# Patient Record
Sex: Female | Born: 1990 | Race: White | Hispanic: No | Marital: Married | Smoking: Former smoker
Health system: Southern US, Community
[De-identification: ages and names within clinical notes are randomized; demographics above are authoritative.]

## PROBLEM LIST (undated history)

## (undated) DIAGNOSIS — H9325 Central auditory processing disorder: Secondary | ICD-10-CM

## (undated) DIAGNOSIS — F909 Attention-deficit hyperactivity disorder, unspecified type: Secondary | ICD-10-CM

## (undated) DIAGNOSIS — K529 Noninfective gastroenteritis and colitis, unspecified: Secondary | ICD-10-CM

## (undated) DIAGNOSIS — F429 Obsessive-compulsive disorder, unspecified: Secondary | ICD-10-CM

## (undated) DIAGNOSIS — N946 Dysmenorrhea, unspecified: Secondary | ICD-10-CM

## (undated) DIAGNOSIS — F419 Anxiety disorder, unspecified: Secondary | ICD-10-CM

## (undated) DIAGNOSIS — R55 Syncope and collapse: Secondary | ICD-10-CM

## (undated) DIAGNOSIS — F4312 Post-traumatic stress disorder, chronic: Secondary | ICD-10-CM

## (undated) DIAGNOSIS — F913 Oppositional defiant disorder: Secondary | ICD-10-CM

## (undated) DIAGNOSIS — F32A Depression, unspecified: Secondary | ICD-10-CM

## (undated) HISTORY — DX: Noninfective gastroenteritis and colitis, unspecified: K52.9

## (undated) HISTORY — DX: Oppositional defiant disorder: F91.3

## (undated) HISTORY — DX: Obsessive-compulsive disorder, unspecified: F42.9

## (undated) HISTORY — PX: WISDOM TOOTH EXTRACTION: SHX21

## (undated) HISTORY — DX: Anxiety disorder, unspecified: F41.9

## (undated) HISTORY — DX: Dysmenorrhea, unspecified: N94.6

## (undated) HISTORY — DX: Syncope and collapse: R55

## (undated) HISTORY — PX: BICEPS TENDON REPAIR: SHX566

## (undated) HISTORY — DX: Attention-deficit hyperactivity disorder, unspecified type: F90.9

## (undated) HISTORY — DX: Depression, unspecified: F32.A

---
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
## 2010-03-26 ENCOUNTER — Ambulatory Visit (HOSPITAL_COMMUNITY): Payer: Self-pay | Admitting: Psychiatry

## 2010-05-26 ENCOUNTER — Ambulatory Visit (HOSPITAL_COMMUNITY)
Admission: RE | Admit: 2010-05-26 | Discharge: 2010-05-26 | Payer: Self-pay | Source: Home / Self Care | Attending: Psychiatry | Admitting: Psychiatry

## 2010-07-09 ENCOUNTER — Encounter (INDEPENDENT_AMBULATORY_CARE_PROVIDER_SITE_OTHER): Payer: BC Managed Care – PPO | Admitting: Psychiatry

## 2010-07-09 DIAGNOSIS — F909 Attention-deficit hyperactivity disorder, unspecified type: Secondary | ICD-10-CM

## 2010-08-27 ENCOUNTER — Encounter (INDEPENDENT_AMBULATORY_CARE_PROVIDER_SITE_OTHER): Payer: BC Managed Care – PPO | Admitting: Psychiatry

## 2010-08-27 DIAGNOSIS — F909 Attention-deficit hyperactivity disorder, unspecified type: Secondary | ICD-10-CM

## 2010-09-30 ENCOUNTER — Encounter (HOSPITAL_COMMUNITY): Payer: BC Managed Care – PPO | Admitting: Psychiatry

## 2010-10-14 ENCOUNTER — Encounter (INDEPENDENT_AMBULATORY_CARE_PROVIDER_SITE_OTHER): Payer: BC Managed Care – PPO | Admitting: Psychiatry

## 2010-10-14 DIAGNOSIS — F411 Generalized anxiety disorder: Secondary | ICD-10-CM

## 2010-10-14 DIAGNOSIS — F909 Attention-deficit hyperactivity disorder, unspecified type: Secondary | ICD-10-CM

## 2010-12-14 ENCOUNTER — Encounter (HOSPITAL_COMMUNITY): Payer: BC Managed Care – PPO | Admitting: Psychiatry

## 2010-12-28 ENCOUNTER — Encounter (INDEPENDENT_AMBULATORY_CARE_PROVIDER_SITE_OTHER): Payer: BC Managed Care – PPO | Admitting: Psychiatry

## 2010-12-28 DIAGNOSIS — F411 Generalized anxiety disorder: Secondary | ICD-10-CM

## 2010-12-28 DIAGNOSIS — F909 Attention-deficit hyperactivity disorder, unspecified type: Secondary | ICD-10-CM

## 2011-02-22 ENCOUNTER — Encounter (HOSPITAL_COMMUNITY): Payer: Self-pay

## 2011-03-28 ENCOUNTER — Encounter (HOSPITAL_COMMUNITY): Payer: Self-pay | Admitting: Psychiatry

## 2011-03-28 ENCOUNTER — Ambulatory Visit (INDEPENDENT_AMBULATORY_CARE_PROVIDER_SITE_OTHER): Payer: BC Managed Care – PPO | Admitting: Psychiatry

## 2011-03-28 VITALS — BP 118/78 | Ht 61.0 in | Wt 132.0 lb

## 2011-03-28 DIAGNOSIS — F913 Oppositional defiant disorder: Secondary | ICD-10-CM

## 2011-03-28 DIAGNOSIS — F909 Attention-deficit hyperactivity disorder, unspecified type: Secondary | ICD-10-CM

## 2011-03-28 DIAGNOSIS — F429 Obsessive-compulsive disorder, unspecified: Secondary | ICD-10-CM

## 2011-03-28 MED ORDER — METHYLPHENIDATE HCL ER (LA) 30 MG PO CP24: 30.0000 mg | ORAL_CAPSULE | Freq: Every day | ORAL | Status: DC

## 2011-03-28 MED ORDER — METHYLPHENIDATE HCL ER (LA) 30 MG PO CP24: 30.0000 mg | ORAL_CAPSULE | ORAL | Status: DC

## 2011-03-28 NOTE — Progress Notes (Signed)
   [facility] Follow-up Outpatient Visit  Jennifer Singh 1990-11-20   Subjective: The patient is a 20 year old female who has been followed by [facility] since November of 2008. She is currently diagnosed with OCD, ADHD, and oppositional defiant disorder. He is still in school. This semester she is taking an [place] class along with theater. She did end up dropping one class. She states her anxiety level is increasing. She is only taking 2 of her Luvox 50 mg in the morning. She supposed be taking it 3 times a day. She does continue to take her Ritalin LA 30 mg daily. The patient is trying to quit smoking. She did have a bad reaction when I gave her Wellbutrin XL in the past. I recommended this point that she try nicotine patches. The patient reports getting her for speeding ticket. Mom is concerned for the for the patient's welfare.  Filed Vitals:   03/28/11 1100  BP: 118/78    Mental Status Examination  Appearance: Casually dressed Alert: Yes Attention: good  Cooperative: Yes Eye Contact: Good Speech: Rapid speech which is normal for patient Psychomotor Activity: Normal Memory/Concentration: Intact Oriented: person, place, time/date and situation Mood: Euthymic Affect: Full Range Thought Processes and Associations: Logical Fund of Knowledge: Fair Thought Content: Denies suicidal or homicidal thought Insight: Fair Judgement: Fair  Diagnosis: OCD, ADHD, ODD  Treatment Plan: I advised patient to increase her Luvox to the 3 pills daily that she supposed to be taking. I feel that this will help cope with her anxiety. She wants to discuss with mom her current symptoms. I plan to see her back in 6 weeks for a 30 minute appointment. I have asked for her to have her mother accompany her to this appointment. Patient is calmly with any further concerns.  Jamse Mead, MD

## 2011-05-11 ENCOUNTER — Ambulatory Visit (HOSPITAL_COMMUNITY): Payer: BC Managed Care – PPO | Admitting: Psychiatry

## 2011-05-27 ENCOUNTER — Ambulatory Visit (HOSPITAL_COMMUNITY): Payer: BC Managed Care – PPO | Admitting: Psychiatry

## 2011-06-03 ENCOUNTER — Ambulatory Visit (HOSPITAL_COMMUNITY): Payer: BC Managed Care – PPO | Admitting: Psychiatry

## 2011-06-08 ENCOUNTER — Ambulatory Visit (INDEPENDENT_AMBULATORY_CARE_PROVIDER_SITE_OTHER): Payer: BC Managed Care – PPO | Admitting: Psychiatry

## 2011-06-08 ENCOUNTER — Encounter (HOSPITAL_COMMUNITY): Payer: Self-pay | Admitting: Psychiatry

## 2011-06-08 DIAGNOSIS — F429 Obsessive-compulsive disorder, unspecified: Secondary | ICD-10-CM

## 2011-06-08 DIAGNOSIS — F909 Attention-deficit hyperactivity disorder, unspecified type: Secondary | ICD-10-CM

## 2011-06-08 DIAGNOSIS — F913 Oppositional defiant disorder: Secondary | ICD-10-CM | POA: Insufficient documentation

## 2011-06-08 DIAGNOSIS — F902 Attention-deficit hyperactivity disorder, combined type: Secondary | ICD-10-CM | POA: Insufficient documentation

## 2011-06-08 MED ORDER — METHYLPHENIDATE HCL ER (LA) 30 MG PO CP24: 30.0000 mg | ORAL_CAPSULE | ORAL | Status: DC

## 2011-06-08 MED ORDER — METHYLPHENIDATE HCL ER (LA) 30 MG PO CP24: 30.0000 mg | ORAL_CAPSULE | Freq: Every day | ORAL | Status: DC

## 2011-06-08 NOTE — Progress Notes (Signed)
   [facility] Follow-up Outpatient Visit  Jennifer Singh 1990-11-20   Subjective: The patient is a 21 year old female who has been followed by [facility] since November of 2008. She is currently diagnosed with OCD, ADHD, and oppositional defiant disorder. She is still in school. Patient is currently taking 4 classes, but is planning on dropping one. She reports her grades as B's and C's. She is happy with this. She did get fired from [employer]. She is still living at home and still dating her girlfriend. He feels like throwing up on the Luvox has helped, however she's been out of it for a week and a half. She mislaid the bottle. She has not checked with the pharmacy to see if she has refills. She endorses good sleep and appetite. She feels like her focus is working well.  Filed Vitals:   06/08/11 1107  BP: 108/62    Mental Status Examination  Appearance: Casually dressed Alert: Yes Attention: good  Cooperative: Yes Eye Contact: Good Speech: Rapid speech which is normal for patient Psychomotor Activity: Normal Memory/Concentration: Intact Oriented: person, place, time/date and situation Mood: Euthymic Affect: Full Range Thought Processes and Associations: Logical Fund of Knowledge: Fair Thought Content: Denies suicidal or homicidal thought Insight: Fair Judgement: Fair  Diagnosis: OCD, ADHD, ODD  Treatment Plan: We will not make any changes today. Patient followup with pharmacy. I will continue her Luvox and Ritalin LA. I will see her back in 3 months. Jamse Mead, MD

## 2011-08-10 ENCOUNTER — Encounter (HOSPITAL_COMMUNITY): Payer: Self-pay | Admitting: Psychiatry

## 2011-08-10 ENCOUNTER — Ambulatory Visit (INDEPENDENT_AMBULATORY_CARE_PROVIDER_SITE_OTHER): Payer: BC Managed Care – PPO | Admitting: Psychiatry

## 2011-08-10 VITALS — BP 118/68 | Ht 61.0 in | Wt 128.0 lb

## 2011-08-10 DIAGNOSIS — F909 Attention-deficit hyperactivity disorder, unspecified type: Secondary | ICD-10-CM

## 2011-08-10 MED ORDER — METHYLPHENIDATE HCL ER (LA) 30 MG PO CP24: 30.0000 mg | ORAL_CAPSULE | Freq: Every day | ORAL | Status: DC

## 2011-08-10 MED ORDER — FLUVOXAMINE MALEATE 100 MG PO TABS: 100.0000 mg | ORAL_TABLET | Freq: Two times a day (BID) | ORAL | Status: DC

## 2011-08-10 NOTE — Progress Notes (Signed)
   [facility] Follow-up Outpatient Visit  Jennifer Singh Mar 09, 1991   Subjective: The patient is a 21 year old female who has been followed by [facility] since November of 2008. She is currently diagnosed with OCD, ADHD, and oppositional defiant disorder. The patient currently stable on Luvox and Ritalin LA. She recalls last month, stating that she was getting panic attacks. She presents today. She reports that she hasn't had any panic attacks in about 3 weeks. She has been worrying a lot. A lot of her stress involves her 28 year old girlfriend. Her last panic attack occurred when the girlfriend went to a party with her friends without her. The patient reports that she stressed over school. She did drop one of her classes. She now has to A's and one D. She and her girlfriend plan on moving in together in approximately 2 months. Patient reports the girlfriend's father is getting worse behavior-wise with girlfriend.  Filed Vitals:   08/10/11 1038  BP: 118/68    Mental Status Examination  Appearance: Casually dressed Alert: Yes Attention: good  Cooperative: Yes Eye Contact: Good Speech: Rapid speech which is normal for patient Psychomotor Activity: Normal Memory/Concentration: Intact Oriented: person, place, time/date and situation Mood: Euthymic Affect: Full Range Thought Processes and Associations: Logical Fund of Knowledge: Fair Thought Content: Denies suicidal or homicidal thought Insight: Fair Judgement: Fair  Diagnosis: OCD, ADHD, ODD  Treatment Plan: We will increase the Luvox to 100 mg twice a day. Continue the Ritalin LA. Patient is provided with 2 discrete scripts. I will see her back in 2 months. Jamse Mead, MD

## 2011-09-06 ENCOUNTER — Ambulatory Visit (HOSPITAL_COMMUNITY): Payer: BC Managed Care – PPO | Admitting: Psychiatry

## 2011-10-10 ENCOUNTER — Ambulatory Visit (HOSPITAL_COMMUNITY): Payer: BC Managed Care – PPO | Admitting: Psychiatry

## 2011-10-21 ENCOUNTER — Encounter (HOSPITAL_COMMUNITY): Payer: Self-pay | Admitting: Psychiatry

## 2011-10-21 ENCOUNTER — Ambulatory Visit (INDEPENDENT_AMBULATORY_CARE_PROVIDER_SITE_OTHER): Payer: BC Managed Care – PPO | Admitting: Psychiatry

## 2011-10-21 VITALS — BP 108/62 | Ht 61.0 in | Wt 129.0 lb

## 2011-10-21 DIAGNOSIS — F429 Obsessive-compulsive disorder, unspecified: Secondary | ICD-10-CM

## 2011-10-21 DIAGNOSIS — F909 Attention-deficit hyperactivity disorder, unspecified type: Secondary | ICD-10-CM

## 2011-10-21 DIAGNOSIS — F913 Oppositional defiant disorder: Secondary | ICD-10-CM

## 2011-10-21 MED ORDER — METHYLPHENIDATE HCL ER (LA) 30 MG PO CP24: 30.0000 mg | ORAL_CAPSULE | Freq: Every day | ORAL | Status: DC

## 2011-10-21 NOTE — Progress Notes (Signed)
   [facility] Follow-up Outpatient Visit  Jennifer Singh 1991/01/29   Subjective: The patient is a 21 year old female who has been followed by [facility] since November of 2008. She is currently diagnosed with OCD, ADHD, and oppositional defiant disorder. At her last appointment, I increased her Luvox to 100 mg twice a day. I also continued her Ritalin LA 30 mg daily. The patient is not having any panic attacks. She is still living at home. His current girlfriend has moved in with the family. This seems to be going well. The patient is now working third shift at a different [employer]. She works from 10 PM to 7 AM. She is asking how to take her Ritalin and still function throughout work hours. She is also asking whether or not she can use a nicotine patch, since she had such a bad reaction to Wellbutrin XL. The patient feels that her anxiety is better controlled. She feels that her stimulant controls her focus and attention. She is calmer in todays appointment.  Filed Vitals:   10/21/11 1402  BP: 108/62    Mental Status Examination  Appearance: Casually dressed Alert: Yes Attention: good  Cooperative: Yes Eye Contact: Good Speech: Rapid speech which is normal for patient Psychomotor Activity: Normal Memory/Concentration: Intact Oriented: person, place, time/date and situation Mood: Euthymic Affect: Full Range Thought Processes and Associations: Logical Fund of Knowledge: Fair Thought Content: Denies suicidal or homicidal thought Insight: Fair Judgement: Fair  Diagnosis: OCD, ADHD, ODD  Treatment Plan: We will continue the Luvox at 100 mg twice a day. Continue the Ritalin LA. Patient is provided with 3 discrete scripts. I will see her back in 3 months. Patient should try taking Ritalin LA 2 hours prior to shift. She may use a nicotine patch. Call with concerns. Jamse Mead, MD

## 2012-01-27 ENCOUNTER — Ambulatory Visit (INDEPENDENT_AMBULATORY_CARE_PROVIDER_SITE_OTHER): Payer: BC Managed Care – PPO | Admitting: Psychiatry

## 2012-01-27 ENCOUNTER — Encounter (HOSPITAL_COMMUNITY): Payer: Self-pay | Admitting: Psychiatry

## 2012-01-27 VITALS — BP 110/65 | Ht 61.0 in | Wt 132.0 lb

## 2012-01-27 DIAGNOSIS — F909 Attention-deficit hyperactivity disorder, unspecified type: Secondary | ICD-10-CM

## 2012-01-27 DIAGNOSIS — F913 Oppositional defiant disorder: Secondary | ICD-10-CM

## 2012-01-27 DIAGNOSIS — F429 Obsessive-compulsive disorder, unspecified: Secondary | ICD-10-CM

## 2012-01-27 MED ORDER — METHYLPHENIDATE HCL ER (LA) 30 MG PO CP24: 30.0000 mg | ORAL_CAPSULE | ORAL | Status: DC

## 2012-01-27 MED ORDER — METHYLPHENIDATE HCL 10 MG PO TABS: 10.0000 mg | ORAL_TABLET | Freq: Every day | ORAL | Status: DC

## 2012-01-27 NOTE — Progress Notes (Signed)
   [facility] Follow-up Outpatient Visit  Jennifer Singh 08/19/1990   Subjective: The patient is a 21 year old female who has been followed by [facility] since November of 2008. She is currently diagnosed with OCD, ADHD, and oppositional defiant disorder. At her last appointment, I did not make any changes. She has not been having any more panic attacks. The patient still living at home. Her girlfriend lives there. The patient reports that she is still in love with her ex-girlfriend. The patient is currently a waitress. She is no longer working at [employer]. She works from 5 PM to 11 PM. She is having a hard time maintaining focus and attention while work. She continues with school. She is currently a Holiday representative, but her hours are not sufficient for her to be classified as a Holiday representative. She is concerned about her father. He is recently turned 80. She reports that he is now an alcoholic. Mom is never home. The patient denies any marijuana use for several months. She is willing to be drug tested today.   Filed Vitals:   01/27/12 1433  BP: 110/65    Mental Status Examination  Appearance: Casually dressed Alert: Yes Attention: good  Cooperative: Yes Eye Contact: Good Speech: Rapid speech which is normal for patient Psychomotor Activity: Normal Memory/Concentration: Intact Oriented: person, place, time/date and situation Mood: Euthymic Affect: Full Range Thought Processes and Associations: Logical Fund of Knowledge: Fair Thought Content: Denies suicidal or homicidal thought Insight: Fair Judgement: Fair  Diagnosis: OCD, ADHD, ODD  Treatment Plan: I will continue the Luvox 100 mg twice a day. I will also continue the Ritalin LA at 30 mg daily. Patient still has one prescription at home. I will add a booster of Ritalin 10 mg for her to take at 4:00 in the afternoon to help with her evening shift. I will see the patient back in 2 months. Patient to call with  concerns. Jamse Mead, MD

## 2012-03-29 ENCOUNTER — Ambulatory Visit (HOSPITAL_COMMUNITY): Payer: Self-pay | Admitting: Psychiatry

## 2012-05-16 ENCOUNTER — Ambulatory Visit (HOSPITAL_COMMUNITY): Payer: Self-pay | Admitting: Psychiatry

## 2012-05-29 ENCOUNTER — Other Ambulatory Visit (HOSPITAL_COMMUNITY): Payer: Self-pay | Admitting: Psychiatry

## 2012-05-29 DIAGNOSIS — F909 Attention-deficit hyperactivity disorder, unspecified type: Secondary | ICD-10-CM

## 2012-05-29 MED ORDER — METHYLPHENIDATE HCL ER (LA) 30 MG PO CP24: 30.0000 mg | ORAL_CAPSULE | ORAL | Status: DC

## 2012-06-07 ENCOUNTER — Ambulatory Visit (INDEPENDENT_AMBULATORY_CARE_PROVIDER_SITE_OTHER): Payer: BC Managed Care – PPO | Admitting: Psychiatry

## 2012-06-07 ENCOUNTER — Encounter (HOSPITAL_COMMUNITY): Payer: Self-pay | Admitting: Psychiatry

## 2012-06-07 VITALS — BP 118/72 | Ht 61.0 in | Wt 142.0 lb

## 2012-06-07 DIAGNOSIS — F913 Oppositional defiant disorder: Secondary | ICD-10-CM

## 2012-06-07 DIAGNOSIS — F902 Attention-deficit hyperactivity disorder, combined type: Secondary | ICD-10-CM

## 2012-06-07 DIAGNOSIS — F429 Obsessive-compulsive disorder, unspecified: Secondary | ICD-10-CM

## 2012-06-07 DIAGNOSIS — F909 Attention-deficit hyperactivity disorder, unspecified type: Secondary | ICD-10-CM

## 2012-06-07 MED ORDER — METHYLPHENIDATE HCL ER (LA) 30 MG PO CP24: 30.0000 mg | ORAL_CAPSULE | Freq: Every day | ORAL | Status: DC

## 2012-06-07 MED ORDER — METHYLPHENIDATE HCL 10 MG PO TABS: 10.0000 mg | ORAL_TABLET | Freq: Every day | ORAL | Status: DC

## 2012-06-07 NOTE — Progress Notes (Signed)
   [facility] Follow-up Outpatient Visit  [name] August 01, 1990   Subjective: The patient is a 22 year old female who has been followed by [facility] since November of 2008. She is currently diagnosed with OCD, ADHD, and oppositional defiant disorder. At her last appointment, I  added her Ritalin 10 mg booster to help with work and homework. I continued her Luvox and Ritalin LA. The patient presents today. She is still in school at [employer]. She will graduate next fall. She thought that she would have everything she needed to transfer to [employer] in the spring, but has to change to a generalized studies. Her girlfriend is still living with her. The girlfriend is now working at [employer]. She is still spending time with dad. She states that he continues to drink. Mom continues to be absent. The patient is very upset because she is up 10 pounds today. She reports that she quit smoking over Christmas and did not take her Ritalin. She endorses good sleep and appetite. She continues to work at [employer], and gets free food at work. She and dad went to [employer] this morning. She interupts several times during the appointment to ask how to lose weight.  Filed Vitals:   06/07/12 1438  BP: 118/72    Mental Status Examination  Appearance: Casually dressed Alert: Yes Attention: good  Cooperative: Yes Eye Contact: Good Speech: Rapid speech which is normal for patient Psychomotor Activity: Normal Memory/Concentration: Intact Oriented: person, place, time/date and situation Mood: Euthymic Affect: Full Range Thought Processes and Associations: Logical Fund of Knowledge: Fair Thought Content: Denies suicidal or homicidal thought Insight: Fair Judgement: Fair  Diagnosis: OCD, ADHD, ODD  Treatment Plan: I will continue the Luvox 100 mg twice a day. I will also continue the Ritalin LA at 30 mg daily and Ritalin 10 mg in the afternoon. I will see the patient back in 3  months. I would like to see her down 5 pounds at that time. Patient may call with concerns.  Jamse Mead, MD

## 2012-08-15 ENCOUNTER — Other Ambulatory Visit (HOSPITAL_COMMUNITY): Payer: Self-pay | Admitting: Psychiatry

## 2012-09-04 ENCOUNTER — Ambulatory Visit (HOSPITAL_COMMUNITY): Payer: Self-pay | Admitting: Psychiatry

## 2012-09-19 ENCOUNTER — Ambulatory Visit (INDEPENDENT_AMBULATORY_CARE_PROVIDER_SITE_OTHER): Payer: BC Managed Care – PPO | Admitting: Obstetrics and Gynecology

## 2012-09-19 ENCOUNTER — Encounter: Payer: Self-pay | Admitting: Obstetrics and Gynecology

## 2012-09-19 VITALS — BP 122/70 | Ht 61.5 in | Wt 139.0 lb

## 2012-09-19 DIAGNOSIS — Z113 Encounter for screening for infections with a predominantly sexual mode of transmission: Secondary | ICD-10-CM

## 2012-09-19 DIAGNOSIS — Z01419 Encounter for gynecological examination (general) (routine) without abnormal findings: Secondary | ICD-10-CM

## 2012-09-19 DIAGNOSIS — Z0000 Encounter for general adult medical examination without abnormal findings: Secondary | ICD-10-CM

## 2012-09-19 LAB — POCT URINALYSIS DIPSTICK
Bilirubin, UA: NEGATIVE
Blood, UA: NEGATIVE
Glucose, UA: NEGATIVE
Ketones, UA: NEGATIVE
Leukocytes, UA: NEGATIVE
Nitrite, UA: NEGATIVE
Protein, UA: NEGATIVE
Urobilinogen, UA: NEGATIVE
pH, UA: 5

## 2012-09-19 NOTE — Patient Instructions (Signed)

## 2012-09-19 NOTE — Progress Notes (Signed)
Patient ID: Jennifer Singh, female   DOB: 10/18/1990, 22 y.o.   MRN: [id number] 22 y.o.  Single  Caucasian female   G0P0 here for annual exam.    Had a problem with urinary frequency and pain.  Tested negative for UTI.  Symptoms resolved and feeling better now.    Having heavy menstrual cycles and back pain.  Takes Midol or NSAID which works.    Gained weight on OCPs.  Declines restarting.     Patient's last menstrual period was 08/21/2012.          Sexually active: yes  The current method of family planning is none.   Has had female partners only. Exercising: walking, running Last mammogram:  never Last pap smear: never History of abnormal pap: no Smoking: 1/2 pack per day.  Tried e cigarettes.  Tried wellbutrin which made patient's OCD symptoms worse.   Alcohol: 3 alcoholic drinks per week Last colonoscopy: never Last Bone Density:  never Last tetanus shot :2010 Did Gardisil vaccine series.   Last cholesterol check: never  Hgb: 13.5               Urine: Neg    Health Maintenance  Topic Date Due  . Chlamydia Screening  10/28/2005  . Pap Smear  10/28/2008  . Tetanus/tdap  10/28/2009  . Influenza Vaccine  01/07/2013    Family History  Problem Relation Age of Onset  . ADD / ADHD Mother   . Depression Maternal Uncle   . Alcohol abuse Maternal Grandfather   . OCD Maternal Grandmother   . Thyroid disease Brother     Patient Active Problem List   Diagnosis Date Noted  . ADHD (attention deficit hyperactivity disorder) 06/08/2011  . ODD (oppositional defiant disorder) 06/08/2011  . OCD (obsessive compulsive disorder) 06/08/2011    Past Medical History  Diagnosis Date  . ADHD (attention deficit hyperactivity disorder)   . Anxiety   . Obsessive-compulsive disorder   . Oppositional defiant disorder   . Dysmenorrhea     Past Surgical History  Procedure Laterality Date  . Wisdom tooth extraction      Allergies: Penicillins  Current Outpatient Prescriptions   Medication Sig Dispense Refill  . fluvoxaMINE (LUVOX) 100 MG tablet TAKE 1 TABLET BY MOUTH TWICE DAILY  60 tablet  2  . methylphenidate (RITALIN LA) 30 MG 24 hr capsule Take 1 capsule (30 mg total) by mouth daily. Fill after 11/20/11  30 capsule  0  . methylphenidate (RITALIN LA) 30 MG 24 hr capsule Take 1 capsule (30 mg total) by mouth daily. Fill after 08/06/12  30 capsule  0  . methylphenidate (RITALIN LA) 30 MG 24 hr capsule Take 1 capsule (30 mg total) by mouth daily. Fill after 07/08/11  30 capsule  0  . methylphenidate (RITALIN LA) 30 MG 24 hr capsule Take 1 capsule (30 mg total) by mouth daily.  30 capsule  0  . methylphenidate (RITALIN LA) 30 MG 24 hr capsule Take 1 capsule (30 mg total) by mouth daily. Fill after 09/09/11  30 capsule  0  . methylphenidate (RITALIN LA) 30 MG 24 hr capsule Take 1 capsule (30 mg total) by mouth daily. Fill after 12/20/11  30 capsule  0  . methylphenidate (RITALIN LA) 30 MG 24 hr capsule Take 1 capsule (30 mg total) by mouth every morning.  30 capsule  0  . methylphenidate (RITALIN LA) 30 MG 24 hr capsule Take 1 capsule (30 mg total) by mouth daily.  30 capsule  0  . methylphenidate (RITALIN LA) 30 MG 24 hr capsule Take 1 capsule (30 mg total) by mouth daily. Fill after 07/07/12  30 capsule  0  . methylphenidate (RITALIN) 10 MG tablet Take 1 tablet (10 mg total) by mouth daily. Take at 4 pm  Fill after 08/06/12  30 tablet  0  . methylphenidate (RITALIN) 10 MG tablet Take 1 tablet (10 mg total) by mouth daily. Daily at 4 pm  30 tablet  0  . methylphenidate (RITALIN) 10 MG tablet Take 1 tablet (10 mg total) by mouth daily. Take at 4 pm  Fill after 07/07/12  30 tablet  0   No current facility-administered medications for this visit.    ROS: Pertinent items are noted in HPI.  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  Wants to go to [employer].   Expecting a promotion at work.   Exam:    BP 122/70  Ht 5' 1.5" (1.562 m)  Wt 139 lb (63.05 kg)  BMI 25.84  kg/m2  LMP 08/21/2012   Wt Readings from Last 3 Encounters:  09/19/12 139 lb (63.05 kg)  06/07/12 142 lb (64.411 kg)  01/27/12 132 lb (59.875 kg)     Ht Readings from Last 3 Encounters:  09/19/12 5' 1.5" (1.562 m)  06/07/12 5\' 1"  (1.549 m)  01/27/12 5\' 1"  (1.549 m)    General appearance: alert, cooperative and appears stated age Head: Normocephalic, without obvious abnormality, atraumatic Neck: no adenopathy, supple, symmetrical, trachea midline and thyroid not enlarged, symmetric, no tenderness/mass/nodules Lungs: clear to auscultation bilaterally Breasts: Inspection negative, No nipple retraction or dimpling, No nipple discharge or bleeding, No axillary or supraclavicular adenopathy, Normal to palpation without dominant masses Heart: regular rate and rhythm Abdomen: soft, non-tender; bowel sounds normal; no masses,  no organomegaly Extremities: extremities normal, atraumatic, no cyanosis or edema Skin: Skin color, texture, turgor normal. No rashes or lesions Lymph nodes: Cervical, supraclavicular, and axillary nodes normal. No abnormal inguinal nodes palpated Neurologic: Grossly normal   Pelvic: External genitalia:  no lesions              Urethra:  normal appearing urethra with no masses, tenderness or lesions              Bartholins and Skenes: normal                 Vagina: normal appearing vagina with normal color and discharge, no lesions              Cervix: normal appearance              Pap taken: yes        Bimanual Exam:  Uterus:  uterus is normal size, shape, consistency and nontender                                      Adnexa: normal adnexa in size, nontender and no masses                                      Rectovaginal: Confirms                                      Anus:  normal sphincter tone, no lesions  A: normal gyn exam Dysmenorrhea. Menorrhagia by history. Smoker.      P: mammogram at age 69 Do periodic self breast exams. STD testing - HIV, RPR,  Hep C aby, HBsAg, GC/CT Pap and reflex HPV testing Patient currently declines ultra low dose OCPs, but will consider. I discussed smoking cessation with patient. return annually or prn     An After Visit Summary was printed and given to the patient.

## 2012-09-20 LAB — GC/CHLAMYDIA PROBE AMP, URINE
Chlamydia, Swab/Urine, PCR: NEGATIVE
GC Probe Amp, Urine: NEGATIVE

## 2012-09-20 LAB — HEPATITIS C ANTIBODY: HCV Ab: NEGATIVE

## 2012-09-20 LAB — STD PANEL
HIV: NONREACTIVE
Hepatitis B Surface Ag: NEGATIVE

## 2012-09-20 LAB — HEMOGLOBIN, FINGERSTICK: Hemoglobin, fingerstick: 13.5 g/dL (ref 12.0–16.0)

## 2012-09-21 ENCOUNTER — Telehealth: Payer: Self-pay

## 2012-09-21 LAB — IPS PAP TEST WITH REFLEX TO HPV

## 2012-09-21 NOTE — Telephone Encounter (Signed)
Patient notified STD blood work negative and GC/CT negative.

## 2012-09-21 NOTE — Telephone Encounter (Signed)
LMOVM  To call for test results. 

## 2012-09-21 NOTE — Telephone Encounter (Signed)
Message copied by Alphonsa Overall on Fri Sep 21, 2012  9:27 AM ------      Message from: Conley Simmonds      Created: Thu Sep 20, 2012  6:57 AM       Please report negative STD blood testing.            Pap, GC/CT are pending. ------

## 2012-09-21 NOTE — Telephone Encounter (Signed)
Returning your call. °

## 2012-09-25 ENCOUNTER — Ambulatory Visit (HOSPITAL_COMMUNITY): Payer: Self-pay | Admitting: Psychiatry

## 2012-09-25 ENCOUNTER — Encounter (HOSPITAL_COMMUNITY): Payer: Self-pay | Admitting: Psychiatry

## 2012-09-25 ENCOUNTER — Ambulatory Visit (INDEPENDENT_AMBULATORY_CARE_PROVIDER_SITE_OTHER): Payer: BC Managed Care – PPO | Admitting: Psychiatry

## 2012-09-25 VITALS — BP 118/72 | Ht 61.0 in | Wt 138.0 lb

## 2012-09-25 DIAGNOSIS — F913 Oppositional defiant disorder: Secondary | ICD-10-CM

## 2012-09-25 DIAGNOSIS — F429 Obsessive-compulsive disorder, unspecified: Secondary | ICD-10-CM

## 2012-09-25 DIAGNOSIS — F909 Attention-deficit hyperactivity disorder, unspecified type: Secondary | ICD-10-CM

## 2012-09-25 DIAGNOSIS — F902 Attention-deficit hyperactivity disorder, combined type: Secondary | ICD-10-CM

## 2012-09-25 MED ORDER — METHYLPHENIDATE HCL ER (OSM) 27 MG PO TBCR: 27.0000 mg | EXTENDED_RELEASE_TABLET | ORAL | Status: DC

## 2012-09-25 NOTE — Progress Notes (Signed)
[facility] Follow-up Outpatient Visit  Jennifer Singh February 22, 1991   Subjective: The patient is a 22 year old female who has been followed by [facility] since November of 2008. She is currently diagnosed with OCD, ADHD, and oppositional defiant disorder. At her last appointment, I did not make any changes. She presents today. She was fired/quit her last job. She is now working at [employer]. She feels that her medication is too strong for work. She has to run around a lot, and burn things off. She almost need some of her ADHD symptoms to help. She is out of school for the summer. She will be completing her program at [employer] in the fall. She has been taking her Luvox. The patient endorses good sleep and appetite. Her mother is about to be promoted to a presidential  position at [employer]. Patient is worried about her father, to see starting to look old. He continues to drink. Her brother is graduating from middle college and has a full ride to [employer]. He has a 4.75 GPA. The patient continues to date. It appears to be going well. The patient was on Concerta in the past, but it's been a long time.  Filed Vitals:   09/25/12 1546  BP: 118/72   Active Ambulatory Problems    Diagnosis Date Noted  . ADHD (attention deficit hyperactivity disorder) 06/08/2011  . ODD (oppositional defiant disorder) 06/08/2011  . OCD (obsessive compulsive disorder) 06/08/2011   Resolved Ambulatory Problems    Diagnosis Date Noted  . No Resolved Ambulatory Problems   Past Medical History  Diagnosis Date  . Anxiety   . Obsessive-compulsive disorder   . Oppositional defiant disorder   . Dysmenorrhea    Current Outpatient Prescriptions on File Prior to Visit  Medication Sig Dispense Refill  . fluvoxaMINE (LUVOX) 100 MG tablet TAKE 1 TABLET BY MOUTH TWICE DAILY  60 tablet  2  . methylphenidate (RITALIN LA) 30 MG 24 hr capsule Take 1 capsule (30 mg total) by mouth daily. Fill  after 07/08/11  30 capsule  0  . methylphenidate (RITALIN LA) 30 MG 24 hr capsule Take 1 capsule (30 mg total) by mouth daily.  30 capsule  0  . methylphenidate (RITALIN LA) 30 MG 24 hr capsule Take 1 capsule (30 mg total) by mouth daily. Fill after 09/09/11  30 capsule  0  . methylphenidate (RITALIN LA) 30 MG 24 hr capsule Take 1 capsule (30 mg total) by mouth daily. Fill after 11/20/11  30 capsule  0  . methylphenidate (RITALIN LA) 30 MG 24 hr capsule Take 1 capsule (30 mg total) by mouth daily. Fill after 12/20/11  30 capsule  0  . methylphenidate (RITALIN LA) 30 MG 24 hr capsule Take 1 capsule (30 mg total) by mouth every morning.  30 capsule  0  . methylphenidate (RITALIN LA) 30 MG 24 hr capsule Take 1 capsule (30 mg total) by mouth daily. Fill after 08/06/12  30 capsule  0  . methylphenidate (RITALIN LA) 30 MG 24 hr capsule Take 1 capsule (30 mg total) by mouth daily.  30 capsule  0  . methylphenidate (RITALIN LA) 30 MG 24 hr capsule Take 1 capsule (30 mg total) by mouth daily. Fill after 07/07/12  30 capsule  0  . methylphenidate (RITALIN) 10 MG tablet Take 1 tablet (10 mg total) by mouth daily. Take at 4 pm  Fill after 08/06/12  30 tablet  0  . methylphenidate (  RITALIN) 10 MG tablet Take 1 tablet (10 mg total) by mouth daily. Daily at 4 pm  30 tablet  0  . methylphenidate (RITALIN) 10 MG tablet Take 1 tablet (10 mg total) by mouth daily. Take at 4 pm  Fill after 07/07/12  30 tablet  0   No current facility-administered medications on file prior to visit.   Review of Systems - General ROS: negative for - sleep disturbance or weight gain Psychological ROS: negative for - anxiety or depression Cardiovascular ROS: no chest pain or dyspnea on exertion Musculoskeletal ROS: negative for - gait disturbance or muscular weakness Neurological ROS: negative for - dizziness, headaches or seizures  Mental Status Examination  Appearance: Casually dressed Alert: Yes Attention: good  Cooperative:  Yes Eye Contact: Good Speech: Rapid speech which is normal for patient Psychomotor Activity: Normal Memory/Concentration: Intact Oriented: person, place, time/date and situation Mood: Euthymic Affect: Full Range Thought Processes and Associations: Logical Fund of Knowledge: Fair Thought Content: Denies suicidal or homicidal thought Insight: Fair Judgement: Fair  Diagnosis: OCD, ADHD, ODD  Treatment Plan: The Ritalin LA and the Ritalin booster. I will start Concerta at 27 mg daily. I will continue the Luvox 100 mg twice a day. I will see the patient back in one month. Patient may call with concerns. Jamse Mead, MD

## 2012-10-26 ENCOUNTER — Ambulatory Visit (HOSPITAL_COMMUNITY): Payer: Self-pay | Admitting: Psychiatry

## 2012-11-05 ENCOUNTER — Ambulatory Visit (INDEPENDENT_AMBULATORY_CARE_PROVIDER_SITE_OTHER): Payer: BC Managed Care – PPO | Admitting: Obstetrics and Gynecology

## 2012-11-05 ENCOUNTER — Encounter: Payer: Self-pay | Admitting: Obstetrics and Gynecology

## 2012-11-05 ENCOUNTER — Telehealth: Payer: Self-pay | Admitting: Certified Nurse Midwife

## 2012-11-05 ENCOUNTER — Ambulatory Visit: Payer: BC Managed Care – PPO | Admitting: Certified Nurse Midwife

## 2012-11-05 VITALS — BP 100/62 | HR 72 | Ht 61.5 in | Wt 140.5 lb

## 2012-11-05 DIAGNOSIS — L209 Atopic dermatitis, unspecified: Secondary | ICD-10-CM

## 2012-11-05 DIAGNOSIS — L2089 Other atopic dermatitis: Secondary | ICD-10-CM

## 2012-11-05 DIAGNOSIS — B373 Candidiasis of vulva and vagina: Secondary | ICD-10-CM

## 2012-11-05 DIAGNOSIS — B3731 Acute candidiasis of vulva and vagina: Secondary | ICD-10-CM

## 2012-11-05 MED ORDER — NYSTATIN-TRIAMCINOLONE 100000-0.1 UNIT/GM-% EX CREA: TOPICAL_CREAM | Freq: Two times a day (BID) | CUTANEOUS | Status: DC

## 2012-11-05 MED ORDER — FLUCONAZOLE 150 MG PO TABS: 150.0000 mg | ORAL_TABLET | Freq: Once | ORAL | Status: DC

## 2012-11-05 NOTE — Patient Instructions (Signed)
Monilial Vaginitis  Vaginitis in a soreness, swelling and redness (inflammation) of the vagina and vulva. Monilial vaginitis is not a sexually transmitted infection.  CAUSES   Yeast vaginitis is caused by yeast (candida) that is normally found in your vagina. With a yeast infection, the candida has overgrown in number to a point that upsets the chemical balance.  SYMPTOMS   · White, thick vaginal discharge.  · Swelling, itching, redness and irritation of the vagina and possibly the lips of the vagina (vulva).  · Burning or painful urination.  · Painful intercourse.  DIAGNOSIS   Things that may contribute to monilial vaginitis are:  · Postmenopausal and virginal states.  · Pregnancy.  · Infections.  · Being tired, sick or stressed, especially if you had monilial vaginitis in the past.  · Diabetes. Good control will help lower the chance.  · Birth control pills.  · Tight fitting garments.  · Using bubble bath, feminine sprays, douches or deodorant tampons.  · Taking certain medications that kill germs (antibiotics).  · Sporadic recurrence can occur if you become ill.  TREATMENT   Your caregiver will give you medication.  · There are several kinds of anti monilial vaginal creams and suppositories specific for monilial vaginitis. For recurrent yeast infections, use a suppository or cream in the vagina 2 times a week, or as directed.  · Anti-monilial or steroid cream for the itching or irritation of the vulva may also be used. Get your caregiver's permission.  · Painting the vagina with methylene blue solution may help if the monilial cream does not work.  · Eating yogurt may help prevent monilial vaginitis.  HOME CARE INSTRUCTIONS   · Finish all medication as prescribed.  · Do not have sex until treatment is completed or after your caregiver tells you it is okay.  · Take warm sitz baths.  · Do not douche.  · Do not use tampons, especially scented ones.  · Wear cotton underwear.  · Avoid tight pants and panty  hose.  · Tell your sexual partner that you have a yeast infection. They should go to their caregiver if they have symptoms such as mild rash or itching.  · Your sexual partner should be treated as well if your infection is difficult to eliminate.  · Practice safer sex. Use condoms.  · Some vaginal medications cause latex condoms to fail. Vaginal medications that harm condoms are:  · Cleocin cream.  · Butoconazole (Femstat®).  · Terconazole (Terazol®) vaginal suppository.  · Miconazole (Monistat®) (may be purchased over the counter).  SEEK MEDICAL CARE IF:   · You have a temperature by mouth above 102° F (38.9° C).  · The infection is getting worse after 2 days of treatment.  · The infection is not getting better after 3 days of treatment.  · You develop blisters in or around your vagina.  · You develop vaginal bleeding, and it is not your menstrual period.  · You have pain when you urinate.  · You develop intestinal problems.  · You have pain with sexual intercourse.  Document Released: 02/02/2005 Document Revised: 07/18/2011 Document Reviewed: 10/17/2008  ExitCare® Patient Information ©2014 [employer].

## 2012-11-05 NOTE — Progress Notes (Signed)
Patient ID: Jennifer Singh, female   DOB: [birth year], 22 y.o.   MRN: [id number]  Subjective  Feeling raw and itching. Used a cream similar to Vagisil and feels worse now. On menses. No discharge or odor. Used scented tampons.  Then used scented sanitary pad. Uses Dial soap, for the last 2 days.   Has a female partner  Objective  Pelvic - vulva with erythema and ring on bottom which outlines pad use. Vagina - menstrual blood.  Small and nontender uterus.  No adnexal masses or tenderness.  Pap - LGSIL 09/19/12.  Wet prep - pH 4.5, Positive for hyphae, negative for clue cells and trichomonas  Assessment  Vulvovaginitis - candida and atopic LGSIL pap.  Plan  Stop irritants. Use Dove soap. Diflucan.  See Epic orders. Mycolog II cream.  See Epic orders. Abnormal paps and HPV discussed with patient. Next pap in May 2015.

## 2012-11-05 NOTE — Telephone Encounter (Signed)
Vaginal itching,

## 2012-11-05 NOTE — Telephone Encounter (Signed)
Spoke with pt about vaginal redness, tenderness, and feeling raw. Pt denies bumps or lesions. Pt has been working a lot and has to wear pants at work. She thinks sweating is causing the problem. OV shed with BS at 2:30 today.

## 2012-11-08 NOTE — Telephone Encounter (Signed)
I agree with your instructions.

## 2012-11-08 NOTE — Telephone Encounter (Signed)
Return call to patient who complains of "discharging" since taking medications she was given on 11-05-12.  Took second Diflucan tablet today.  States cream actually causes a lot of burning when applied so really isn't using this. Creamy vaginal discharge with no odor.  No other symptoms but concerned if this is to be expected.  Overall doing much better. Denies pain or fever.  Advised ok to D/C cream and keep area clean and dry, monitor and observe, call if worsens or other symptoms develop.  Will review with MD and call back if additional instructions.

## 2012-11-08 NOTE — Telephone Encounter (Signed)
Patient is having some problems and is asking to talk with a nurse.( no details given)

## 2012-11-13 ENCOUNTER — Ambulatory Visit (HOSPITAL_COMMUNITY): Payer: Self-pay | Admitting: Psychiatry

## 2012-12-11 ENCOUNTER — Ambulatory Visit (HOSPITAL_COMMUNITY): Payer: Self-pay | Admitting: Psychiatry

## 2013-02-12 ENCOUNTER — Ambulatory Visit (HOSPITAL_COMMUNITY): Payer: Self-pay | Admitting: Psychiatry

## 2013-02-14 ENCOUNTER — Encounter (HOSPITAL_COMMUNITY): Payer: Self-pay | Admitting: Psychiatry

## 2013-02-14 ENCOUNTER — Ambulatory Visit (INDEPENDENT_AMBULATORY_CARE_PROVIDER_SITE_OTHER): Payer: BC Managed Care – PPO | Admitting: Psychiatry

## 2013-02-14 VITALS — BP 112/72 | Ht 61.0 in | Wt 144.0 lb

## 2013-02-14 DIAGNOSIS — F913 Oppositional defiant disorder: Secondary | ICD-10-CM

## 2013-02-14 DIAGNOSIS — F909 Attention-deficit hyperactivity disorder, unspecified type: Secondary | ICD-10-CM

## 2013-02-14 DIAGNOSIS — F429 Obsessive-compulsive disorder, unspecified: Secondary | ICD-10-CM

## 2013-02-14 DIAGNOSIS — F902 Attention-deficit hyperactivity disorder, combined type: Secondary | ICD-10-CM

## 2013-02-14 MED ORDER — METHYLPHENIDATE HCL ER (OSM) 18 MG PO TBCR: 18.0000 mg | EXTENDED_RELEASE_TABLET | ORAL | Status: DC

## 2013-02-14 MED ORDER — FLUVOXAMINE MALEATE 100 MG PO TABS: ORAL_TABLET | ORAL | Status: DC
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
## 2013-03-11 ENCOUNTER — Telehealth: Payer: Self-pay | Admitting: Obstetrics and Gynecology

## 2013-03-11 NOTE — Telephone Encounter (Signed)
Patient has lump on inside crease of right leg. Its about the size of a dime and painful. Just came up yesterday. Has changed soaps recently.

## 2013-03-11 NOTE — Telephone Encounter (Signed)
Spoke with patient. She states that she has noticed a lump on the inside of leg, near groin. Unsure if it is ingrown hair or what it is. Causing some pain on inner leg. No fevers or streaking of redness. I advised that this is difficult to assess over the phone. Offered OV today or tomorrow and patient declines. She states she works full time and is difficult to get to appointment. Patient states that she will wait until tomorrow to see how it is and if not improved she will go to urgent care or call us back for appointment.   Routing to provider for final review. Patient agreeable to disposition. Will close encounter

## 2013-03-14 ENCOUNTER — Other Ambulatory Visit: Payer: Self-pay

## 2013-03-18 ENCOUNTER — Ambulatory Visit (HOSPITAL_COMMUNITY): Payer: Self-pay | Admitting: Psychiatry

## 2013-03-29 ENCOUNTER — Telehealth: Payer: Self-pay | Admitting: Obstetrics and Gynecology

## 2013-03-29 NOTE — Telephone Encounter (Signed)
Pt. has a lump on her thigh close to her vaginal area. She states it has been getting bigger and it hard about the size of a quarter.

## 2013-03-29 NOTE — Telephone Encounter (Signed)
Thank for you trying to schedule this patient.  We will wait to hear from her.

## 2013-03-29 NOTE — Telephone Encounter (Signed)
Spoke with patient regarding lump near vaginal area. We had previously spoke about this and patient declined appointment at that time on 11/3. Patient states lump has grown but is not painful. No drainage, no streaking of red on skin. States "it looks purple when I squeeze it". Advised not to squeeze area. Offered appointment and patient again declined. I advised again that this cannot be assessed over the phone and if she is concerned she needs to schedule appointment or be seen in after hours care. Patient states she could come in on Wednesday morning, she then stated she needed to check her schedule. I advised I could hold while she checked schedule and I did, patient then returned to phone and states she will call back. States that she understands this issue needs to be evaluated and will seek after hours care or call back on Monday to schedule appointment for Wednesday.

## 2013-05-07 ENCOUNTER — Ambulatory Visit (INDEPENDENT_AMBULATORY_CARE_PROVIDER_SITE_OTHER): Payer: BC Managed Care – PPO | Admitting: Family Medicine

## 2013-05-07 ENCOUNTER — Encounter: Payer: Self-pay | Admitting: Family Medicine

## 2013-05-07 VITALS — BP 116/77 | HR 87 | Ht 62.0 in | Wt 147.0 lb

## 2013-05-07 DIAGNOSIS — M25569 Pain in unspecified knee: Secondary | ICD-10-CM

## 2013-05-07 DIAGNOSIS — M25562 Pain in left knee: Secondary | ICD-10-CM

## 2013-05-07 NOTE — Patient Instructions (Signed)
You have inflammation, hematoma (collection of bruising) in this area caused by repetitive kneeling. Avoid kneeling on this knee for at least 4 weeks. Knee pad to protect it from being hit also. Icing 15 minutes at a time 3-4 times a day. Ibuprofen 3 tabs three times a day with food OR aleve 2 tabs twice a day with food for pain and inflammation - take regularly for 1 week then as needed. Let me know if you need a work note and we can fax one in. Follow up with me in 1 month or as needed.

## 2013-05-08 ENCOUNTER — Encounter: Payer: Self-pay | Admitting: Family Medicine

## 2013-05-08 DIAGNOSIS — M25562 Pain in left knee: Secondary | ICD-10-CM | POA: Insufficient documentation

## 2013-05-08 NOTE — Progress Notes (Signed)
Patient ID: Jennifer Singh, female   DOB: [birth year], 22 y.o.   MRN: [id number]  PCP: No PCP Per Patient  Subjective:   HPI: Patient is a 22 y.o. female here for left knee pain.  Patient denies known injury. States two weeks ago when putting on shorts she noticed anterior left knee was sore (at tibial tubercle). She does kneel at work repetitively at times then picks things up. Some swelling. Can't put pressure directly on knee anymore. Never had osgood-schlatter diagnosis. Not taking any medicines or using a kneepad. No prior knee injuries. No catching, locking, giving out.  Past Medical History  Diagnosis Date  . ADHD (attention deficit hyperactivity disorder)   . Anxiety   . Obsessive-compulsive disorder   . Oppositional defiant disorder   . Dysmenorrhea     Current Outpatient Prescriptions on File Prior to Visit  Medication Sig Dispense Refill  . fluvoxaMINE (LUVOX) 100 MG tablet TAKE 1 TABLET BY MOUTH TWICE DAILY  60 tablet  2  . methylphenidate (CONCERTA) 18 MG CR tablet Take 1 tablet (18 mg total) by mouth every morning.  30 tablet  0   No current facility-administered medications on file prior to visit.    Past Surgical History  Procedure Laterality Date  . Wisdom tooth extraction      Allergies  Allergen Reactions  . Penicillins Other (See Comments)    unknown    History   Social History  . Marital Status: Single    Spouse Name: N/A    Number of Children: N/A  . Years of Education: N/A   Occupational History  . Not on file.   Social History Main Topics  . Smoking status: Current Every Day Smoker -- 1.00 packs/day for 2 years    Types: Cigarettes  . Smokeless tobacco: Never Used  . Alcohol Use: Yes     Comment: 3 alcoholic drinks per week  . Drug Use: No  . Sexual Activity: Yes     Comment: lesbian   Other Topics Concern  . Not on file   Social History Narrative  . No narrative on file    Family History  Problem Relation Age of  Onset  . ADD / ADHD Mother   . Depression Maternal Uncle   . Alcohol abuse Maternal Grandfather   . OCD Maternal Grandmother   . Thyroid disease Brother   . Hypertension Brother   . Hyperlipidemia Brother   . Diabetes Brother   . Hyperlipidemia Father   . Hypertension Father   . Heart attack Neg Hx     BP 116/77  Pulse 87  Ht 5\' 2"  (1.575 m)  Wt 147 lb (66.679 kg)  BMI 26.88 kg/m2  Review of Systems: See HPI above.    Objective:  Physical Exam:  Gen: NAD  Left knee: Swelling over tibial tubercle.  No other deformity, ecchymoses. TTP over tubercle.  No joint line, other tenderness. FROM with pain on flexion. Negative ant/post drawers. Negative valgus/varus testing. Negative lachmanns. Negative mcmurrays, apleys, patellar apprehension, clarkes. NV intact distally.    MSK u/s:  Increased soft tissue swelling superficial to tibial tubercle with apparent small hematoma, increased neovascularity.  No bony irregularity.  Apophysis has closed.  Assessment & Plan:  1. Left knee pain - consistent with a small hematoma with inflammation from repetitive kneeling.  Icing, nsaids, knee pad.  Avoid kneeling and striking knee in this location.  F/u in 1 month or prn.

## 2013-05-08 NOTE — Assessment & Plan Note (Signed)
consistent with a small hematoma with inflammation from repetitive kneeling.  Icing, nsaids, knee pad.  Avoid kneeling and striking knee in this location.  F/u in 1 month or prn.

## 2013-08-12 ENCOUNTER — Ambulatory Visit (INDEPENDENT_AMBULATORY_CARE_PROVIDER_SITE_OTHER): Payer: BC Managed Care – PPO | Admitting: Psychiatry

## 2013-08-12 ENCOUNTER — Encounter (HOSPITAL_COMMUNITY): Payer: Self-pay | Admitting: Psychiatry

## 2013-08-12 VITALS — BP 111/66 | HR 82 | Wt 148.0 lb

## 2013-08-12 DIAGNOSIS — F429 Obsessive-compulsive disorder, unspecified: Secondary | ICD-10-CM

## 2013-08-12 MED ORDER — FLUVOXAMINE MALEATE 100 MG PO TABS: ORAL_TABLET | ORAL | Status: DC

## 2013-08-12 NOTE — Progress Notes (Signed)
[facility] [phone] Progress Note  Jennifer Singh [id number] 23 y.o.  08/12/2013 2:44 PM  Chief Complaint:  HPI Comments: Jennifer Singh is  a 23 y/o female with a past psychiatric history significant for Obsessive Compulsive Disorder, ADHD. The patient is referred to psychiatric services for  medication management.    . Location: She report she is out of her medications but continues to have OCD symptoms.   . Quality: The patient reports that her main stressors is work.   In the area of affective symptoms, patient appears mildly anxious. Patient endorses current suicidal ideation, intent, or plan. Patient denies current homicidal ideation, intent, or plan. Patient denies auditory hallucinations. Patient denies visual hallucinations. Patient denies symptoms of paranoia. Patient states sleep is good, with approximately 6 hours of sleep per night. Appetite is increase. Energy level is high. Patient denies symptoms of anhedonia. Patient denies hopelessness, helplessness, or guilt.    . Severity: Depression: 7/10 (0=Very depressed; 5=Neutral; 10=Very Happy)  Anxiety- 10/10 (0=no anxiety; 5= moderate/tolerable anxiety; 10= panic attacks)  . Duration-Since she was 6 years  . Timing: Throughout the day  . Context; Obsessive thoughts, and compulsions  . Modifying factors: None  . Associated signs and symptoms : Denies any/ No reported recent episodes consistent with mania, particularly decreased need for sleep with increased energy, grandiosity, impulsivity, hyperverbal and pressured speech, or increased productivity. Denies any recent symptoms consistent with psychosis, particularly auditory or visual hallucinations, thought broadcasting/insertion/withdrawal, or ideas of reference.  Denies any history of trauma or symptoms consistent with PTSD such as flashbacks, nightmares, hypervigilance, feelings of numbness or inability to connect with others.    History of Present  Illness: Suicidal Ideation: Negative Plan Formed: Negative Patient has means to carry out plan: Negative  Homicidal Ideation: Negative Plan Formed: Negative Patient has means to carry out plan: Negative    Past Medical Family, Social History:  Past Medical History  Diagnosis Date  . ADHD (attention deficit hyperactivity disorder)   . Anxiety   . Obsessive-compulsive disorder   . Oppositional defiant disorder   . Dysmenorrhea    Family History  Problem Relation Age of Onset  . ADD / ADHD Mother   . Depression Maternal Uncle   . Alcohol abuse Maternal Grandfather   . OCD Maternal Grandmother   . Thyroid disease Brother   . Hypertension Brother   . Hyperlipidemia Brother   . Diabetes Brother   . Hyperlipidemia Father   . Hypertension Father   . Heart attack Neg Hx    History   Social History  . Marital Status: Single    Spouse Name: N/A    Number of Children: N/A  . Years of Education: N/A   Social History Main Topics  . Smoking status: Current Every Day Smoker -- 1.00 packs/day for 2 years    Types: Cigarettes  . Smokeless tobacco: Never Used  . Alcohol Use: 3.0 oz/week    6 drink(s) per week     Comment: 3 alcoholic drinks per week  . Drug Use: No  . Sexual Activity: Yes     Comment: lesbian   Other Topics Concern  . None   Social History Narrative  . None    Outpatient Encounter Prescriptions as of 08/12/2013  Medication Sig  . fluvoxaMINE (LUVOX) 100 MG tablet TAKE 1 TABLET BY MOUTH TWICE DAILY  . methylphenidate (CONCERTA) 18 MG CR tablet Take 1 tablet (18 mg total) by mouth every morning.    Past Psychiatric  History/Hospitalization(s): Anxiety: No Bipolar Disorder: No Depression: No Mania: No Psychosis: No Schizophrenia: No Personality Disorder: No Hospitalization for psychiatric illness: No History of Electroconvulsive Shock Therapy: Negative Prior Suicide Attempts: Negative  Review of Systems: Review of Systems  Constitutional: Negative  for fever, chills and weight loss.  Respiratory: Negative for cough, hemoptysis and sputum production.   Cardiovascular: Negative for chest pain and palpitations.  Gastrointestinal: Negative for heartburn, nausea, vomiting, abdominal pain, diarrhea and constipation.    Psychiatric: Agitation: Negative Hallucination: Negative Depressed Mood: Negative Insomnia: No Hypersomnia: No Altered Concentration: No Feels Worthless: No Grandiose Ideas: No Belief In Special Powers: No New/Increased Substance Abuse: No Compulsions: No  Neurologic: Headache: Negative Seizure: Negative Paresthesias: Negative Physical Exam: Constitutional:  BP 111/66  Pulse 82  Wt 148 lb (67.132 kg)  LMP 08/05/2013  General Appearance: alert, oriented, no acute distress  Musculoskeletal: Gait & Station: normal Patient leans: Right  Psychiatric Specialty Exam:  Psychiatric Specialty Exam:   General Appearance: Casual and Well Groomed  Eye Contact::  Good  Speech:  Clear and Coherent and Normal Rate  Volume:  Normal  Mood:  "good"  Affect:  Appropriate, Congruent and Full Range  Thought Process:  Coherent, Goal Directed, Linear and Logical  Orientation:  Full (Time, Place, and Person)  Thought Content:  WDL  Suicidal Thoughts:  No  Homicidal Thoughts:  No  Memory:  Immediate;   Good Recent;   Good Remote;   Good  Judgement:  Fair  Insight:  Fair  Psychomotor Activity:  Normal  Concentration:  Fair  Recall:  Good  Akathisia:  No  Handed:  Right  AIMS (if indicated):  Not indicated  Assets:  Communication Skills   language intact  fund of knowledge is average     Medical Decision Making (Choose Three): Established Problem, Worsening (2)  Assessment: Axis I: Obsessive Compulsive Disorder-mild worsening   Plan:   Plan of Care:  PLAN:  1. Affirm with the patient that the medications are taken as ordered. Patient  expressed understanding of how their medications were to be used.     Laboratory:   No labs warranted at this time.   Psychotherapy: Therapy: brief supportive therapy provided.  Discussed psychosocial stressors in detail. More than 50% of the visit was spent on individual therapy/counseling.   Medications:  Continue the following psychiatric medications as written prior to this appointment with the following changes::  a) Increase Luvox to 150 mg daily. -Risks and benefits, side effects and alternatives discussed with patient, he/she was given an opportunity to ask questions about his/her medication, illness, and treatment. All current psychiatric medications have been reviewed and discussed with the patient and adjusted as clinically appropriate. The patient has been provided an accurate and updated list of the medications being now prescribed.   Routine PRN Medications:  Negative  Consultations: The patient was encouraged to keep all PCP and specialty clinic appointments.   Safety Concerns:   Patient told to call clinic if any problems occur. Patient advised to go to  ER  if s/he should develop SI/HI, side effects, or if symptoms worsen. Has crisis numbers to call if needed.    Other:   8. Patient was instructed to return to clinic in 1 months.  9. The patient was advised to call and cancel their mental health appointment within 24 hours of the appointment, if they are unable to keep the appointment, as well as the three no show and termination from clinic policy. 10.  The patient expressed understanding of the plan and agrees with the above. 11 Advised patient I would no longer be at this practice after August 21, 2013.  Time Spent: 25 minutes  Coralyn Helling, MD 08/12/2013

## 2013-09-20 ENCOUNTER — Encounter: Payer: Self-pay | Admitting: Obstetrics and Gynecology

## 2013-09-20 ENCOUNTER — Ambulatory Visit: Payer: BC Managed Care – PPO | Admitting: Obstetrics and Gynecology

## 2013-11-18 ENCOUNTER — Telehealth: Payer: Self-pay | Admitting: Obstetrics and Gynecology

## 2013-11-18 NOTE — Telephone Encounter (Signed)
Patient is asking to have a dnka fee waived. Patient says she did not get a reminder call for this appointment 09/20/13. She does not think it is fair to charge for a missed appointment when she did not receive a call.  I checked the reminder call log and she did receive a call 09/17/13 @ 8:29am (ans by machine). [id number] is also active on MyChart. She did not received a text (number missing from mobile field) or e-mail (e-mail box not checked in preferences).

## 2013-11-19 NOTE — Telephone Encounter (Signed)
Patient aware and rescheduled to a new day.

## 2013-11-19 NOTE — Telephone Encounter (Signed)
i will void as one time courtesy. Please inform patient this is our office policy and cannot be waived in the future if this happens again. Thanks

## 2013-12-03 ENCOUNTER — Ambulatory Visit (INDEPENDENT_AMBULATORY_CARE_PROVIDER_SITE_OTHER): Payer: BC Managed Care – PPO | Admitting: Psychiatry

## 2013-12-03 ENCOUNTER — Encounter (HOSPITAL_COMMUNITY): Payer: Self-pay | Admitting: Psychiatry

## 2013-12-03 VITALS — BP 112/65 | HR 86 | Ht 62.0 in | Wt 145.0 lb

## 2013-12-03 DIAGNOSIS — F429 Obsessive-compulsive disorder, unspecified: Secondary | ICD-10-CM

## 2013-12-03 MED ORDER — FLUVOXAMINE MALEATE 100 MG PO TABS: ORAL_TABLET | ORAL | Status: DC

## 2013-12-03 NOTE — Progress Notes (Signed)
Patient ID: Catalina Lunger, female   DOB: 1990-11-07, 23 y.o.   MRN: [id number]  [facility] [address] Progress Note  Caydee Talkington [id number] 23 y.o.  12/03/2013 2:32 PM  Chief Complaint:  HPI Comments: Ms. Nath is  a 23 y/o female with a past psychiatric history significant for Obsessive Compulsive Disorder, ADHD. The patient is referred to psychiatric services for  medication management.    . Location: She report she is tolerating Luvox. Luvox medication was increased to 150 mg. She continues to have some compulsions that is she has to get everything in order and in multiples of 3. Obsessions include whether mellitus off whether her car doors off. She describes intensity of these obsessions are low with the medication.   . Quality: The patient reports that her main stressors is work. She also has quit smoking and is now on a paper smoking. She is suffering from flulike symptoms and monitor take 1-2 days off from work. She talked about options of quitting smoking I mentioned about Wellbutrin but she remains reluctant to start him but said that it did not do well in the past.  In the area of affective symptoms, patient appears mildly anxious. Patient endorses current suicidal ideation, intent, or plan. Patient denies current homicidal ideation, intent, or plan. Patient denies auditory hallucinations. Patient denies visual hallucinations. Patient denies symptoms of paranoia. Patient states sleep is good, with approximately 6 hours of sleep per night. Appetite is increase. Energy level is high. Patient denies symptoms of anhedonia. Patient denies hopelessness, helplessness, or guilt.    . Severity: Depression: 7/10 (0=Very depressed; 5=Neutral; 10=Very Happy)  Anxiety- 7/10 (0=no anxiety; 5= moderate/tolerable anxiety; 10= panic attacks)  . Duration-Since she was 6 years  . Timing: Throughout the day  . Context; Obsessive thoughts, and compulsions  . Modifying factors:  None  . Associated signs and symptoms : Denies any/ No reported recent episodes consistent with mania, particularly decreased need for sleep with increased energy, grandiosity, impulsivity, hyperverbal and pressured speech, or increased productivity. Denies any recent symptoms consistent with psychosis, particularly auditory or visual hallucinations, thought broadcasting/insertion/withdrawal, or ideas of reference.  Denies any history of trauma or symptoms consistent with PTSD such as flashbacks, nightmares, hypervigilance, feelings of numbness or inability to connect with others.   She also was inquiring for ADHD medication but I cautioned that dose stimulant medication of him make her more anxious so she withdraw from that plan.      Past Medical Family, Social History:  Past Medical History  Diagnosis Date  . ADHD (attention deficit hyperactivity disorder)   . Anxiety   . Obsessive-compulsive disorder   . Oppositional defiant disorder   . Dysmenorrhea    Family History  Problem Relation Age of Onset  . ADD / ADHD Mother   . Depression Maternal Uncle   . Alcohol abuse Maternal Grandfather   . OCD Maternal Grandmother   . Thyroid disease Brother   . Hypertension Brother   . Hyperlipidemia Brother   . Diabetes Brother   . Hyperlipidemia Father   . Hypertension Father   . Heart attack Neg Hx    History   Social History  . Marital Status: Single    Spouse Name: N/A    Number of Children: N/A  . Years of Education: N/A   Social History Main Topics  . Smoking status: Current Every Day Smoker -- 1.00 packs/day for 2 years    Types: Cigarettes  . Smokeless tobacco: Never  Used  . Alcohol Use: 3.0 oz/week    6 drink(s) per week     Comment: 3 alcoholic drinks per week  . Drug Use: No  . Sexual Activity: Yes     Comment: lesbian   Other Topics Concern  . None   Social History Narrative  . None    Outpatient Encounter Prescriptions as of 12/03/2013  Medication Sig  .  fluvoxaMINE (LUVOX) 100 MG tablet TAKE 1.5 tablet daily (150 mg)  . [DISCONTINUED] fluvoxaMINE (LUVOX) 100 MG tablet TAKE 1.5 tablet daily (150 mg)    Past Psychiatric History/Hospitalization(s): Anxiety: No Bipolar Disorder: No Depression: No Mania: No Psychosis: No Schizophrenia: No Personality Disorder: No Hospitalization for psychiatric illness: No History of Electroconvulsive Shock Therapy: Negative Prior Suicide Attempts: Negative  Review of Systems: Review of Systems  Constitutional: Negative for fever, chills and weight loss.  Respiratory: Negative for cough, hemoptysis and sputum production.   Cardiovascular: Negative for chest pain and palpitations.  Gastrointestinal: Negative for heartburn, nausea, vomiting, abdominal pain, diarrhea and constipation.  Psychiatric/Behavioral: Negative for suicidal ideas, hallucinations and substance abuse.    Psychiatric: Agitation: Negative Hallucination: Negative Depressed Mood: Negative Insomnia: No Hypersomnia: No Altered Concentration: No Feels Worthless: No Grandiose Ideas: No Belief In Special Powers: No New/Increased Substance Abuse: No Compulsions: No  Neurologic: Headache: Negative Seizure: Negative Paresthesias: Negative Physical Exam: Constitutional:  BP 112/65  Pulse 86  Ht 5\' 2"  (1.575 m)  Wt 145 lb (65.772 kg)  BMI 26.51 kg/m2  General Appearance: alert, oriented, no acute distress  Musculoskeletal: Gait & Station: normal Patient leans: Right  Psychiatric Specialty Exam:  Psychiatric Specialty Exam:   General Appearance: Casual and Well Groomed  Eye Contact::  Good  Speech:  Clear and Coherent and Normal Rate  Volume:  Normal  Mood:  "good"  Affect:  Appropriate, Congruent and Full Range  Thought Process:  Coherent, Goal Directed, Linear and Logical  Orientation:  Full (Time, Place, and Person)  Thought Content:  WDL  Suicidal Thoughts:  No  Homicidal Thoughts:  No  Memory:  Immediate;    Good Recent;   Good Remote;   Good  Judgement:  Fair  Insight:  Fair  Psychomotor Activity:  Normal  Concentration:  Fair  Recall:  Good  Akathisia:  No  Handed:  Right  AIMS (if indicated):  Not indicated  Assets:  Communication Skills   language intact  fund of knowledge is average       Assessment: Axis I: Obsessive Compulsive Disorder-mild worsening   Plan:   Plan of Care:  PLAN:  1. Affirm with the patient that the medications are taken as ordered. Patient  expressed understanding of how their medications were to be used.    Laboratory:   No labs warranted at this time.   Psychotherapy: Therapy: brief supportive therapy provided.  Discussed psychosocial stressors in detail. More than 50% of the visit was spent on individual therapy/counseling.   Medications:  Continue the following psychiatric medications as written prior to this appointment with the following changes::  a)  Luvox to 150 mg daily. -Risks and benefits, side effects and alternatives discussed with patient, he/she was given an opportunity to ask questions about his/her medication, illness, and treatment. All current psychiatric medications have been reviewed and discussed with the patient and adjusted as clinically appropriate. The patient has been provided an accurate and updated list of the medications being now prescribed.   Routine PRN Medications:  Negative  Consultations: The patient  was encouraged to keep all PCP and specialty clinic appointments.   Safety Concerns:   Patient told to call clinic if any problems occur. Patient advised to go to  ER  if s/he should develop SI/HI, side effects, or if symptoms worsen. Has crisis numbers to call if needed.    Other:   8. Patient was instructed to return to clinic in 1 months.  9. The patient was advised to call and cancel their mental health appointment within 24 hours of the appointment, if they are unable to keep the appointment, as well as the three  no show and termination from clinic policy. 10. The patient expressed understanding of the plan and agrees with the above. She will inquire often takes from her primary care provider. As of now she feels comfortable with Luvox. I would recommend not to start her back on a stimulant medication considering her anxiety level.   Time Spent: 25 minutes  Merian Capron, MD 12/03/2013

## 2013-12-25 ENCOUNTER — Other Ambulatory Visit: Payer: Self-pay | Admitting: Obstetrics and Gynecology

## 2013-12-25 ENCOUNTER — Encounter: Payer: Self-pay | Admitting: Obstetrics and Gynecology

## 2013-12-25 ENCOUNTER — Ambulatory Visit (INDEPENDENT_AMBULATORY_CARE_PROVIDER_SITE_OTHER): Payer: BC Managed Care – PPO | Admitting: Obstetrics and Gynecology

## 2013-12-25 VITALS — BP 124/80 | HR 68 | Ht 62.0 in | Wt 159.0 lb

## 2013-12-25 DIAGNOSIS — Z01419 Encounter for gynecological examination (general) (routine) without abnormal findings: Secondary | ICD-10-CM

## 2013-12-25 DIAGNOSIS — N926 Irregular menstruation, unspecified: Secondary | ICD-10-CM

## 2013-12-25 DIAGNOSIS — Z0000 Encounter for general adult medical examination without abnormal findings: Secondary | ICD-10-CM

## 2013-12-25 DIAGNOSIS — R635 Abnormal weight gain: Secondary | ICD-10-CM | POA: Insufficient documentation

## 2013-12-25 LAB — POCT URINALYSIS DIPSTICK
Bilirubin, UA: NEGATIVE
Blood, UA: NEGATIVE
Glucose, UA: NEGATIVE
Ketones, UA: NEGATIVE
Leukocytes, UA: NEGATIVE
Nitrite, UA: NEGATIVE
Protein, UA: NEGATIVE
Urobilinogen, UA: NEGATIVE
pH, UA: 6

## 2013-12-25 LAB — COMPREHENSIVE METABOLIC PANEL
ALT: 17 U/L (ref 0–35)
AST: 17 U/L (ref 0–37)
Albumin: 4.3 g/dL (ref 3.5–5.2)
Alkaline Phosphatase: 81 U/L (ref 39–117)
BUN: 10 mg/dL (ref 6–23)
CO2: 29 mEq/L (ref 19–32)
Calcium: 9.3 mg/dL (ref 8.4–10.5)
Chloride: 103 mEq/L (ref 96–112)
Creat: 0.62 mg/dL (ref 0.50–1.10)
Glucose, Bld: 87 mg/dL (ref 70–99)
Potassium: 4.4 mEq/L (ref 3.5–5.3)
Sodium: 138 mEq/L (ref 135–145)
Total Bilirubin: 0.3 mg/dL (ref 0.2–1.2)
Total Protein: 7 g/dL (ref 6.0–8.3)

## 2013-12-25 LAB — LIPID PANEL
Cholesterol: 125 mg/dL (ref 0–200)
HDL: 42 mg/dL (ref >39)
LDL Cholesterol: 69 mg/dL (ref 0–99)
Total CHOL/HDL Ratio: 3 Ratio
Triglycerides: 69 mg/dL (ref <150)
VLDL: 14 mg/dL (ref 0–40)

## 2013-12-25 LAB — TSH: TSH: 3.174 u[IU]/mL (ref 0.350–4.500)

## 2013-12-25 MED ORDER — MEDROXYPROGESTERONE ACETATE 10 MG PO TABS: 10.0000 mg | ORAL_TABLET | Freq: Every day | ORAL | Status: DC

## 2013-12-25 NOTE — Telephone Encounter (Signed)
No 90 day supply. This is only a 10 day prescription.

## 2013-12-25 NOTE — Patient Instructions (Signed)
EXERCISE AND DIET:  We recommended that you start or continue a regular exercise program for good health. Regular exercise means any activity that makes your heart beat faster and makes you sweat.  We recommend exercising at least 30 minutes per day at least 3 days a week, preferably 4 or 5.  We also recommend a diet low in fat and sugar.  Inactivity, poor dietary choices and obesity can cause diabetes, heart attack, stroke, and kidney damage, among others.    ALCOHOL AND SMOKING:  Women should limit their alcohol intake to no more than 7 drinks/beers/glasses of wine (combined, not each!) per week. Moderation of alcohol intake to this level decreases your risk of breast cancer and liver damage. And of course, no recreational drugs are part of a healthy lifestyle.  And absolutely no smoking or even second hand smoke. Most people know smoking can cause heart and lung diseases, but did you know it also contributes to weakening of your bones? Aging of your skin?  Yellowing of your teeth and nails?  CALCIUM AND VITAMIN D:  Adequate intake of calcium and Vitamin D are recommended.  The recommendations for exact amounts of these supplements seem to change often, but generally speaking 600 mg of calcium (either carbonate or citrate) and 800 units of Vitamin D per day seems prudent. Certain women may benefit from higher intake of Vitamin D.  If you are among these women, your doctor will have told you during your visit.    PAP SMEARS:  Pap smears, to check for cervical cancer or precancers,  have traditionally been done yearly, although recent scientific advances have shown that most women can have pap smears less often.  However, every woman still should have a physical exam from her gynecologist every year. It will include a breast check, inspection of the vulva and vagina to check for abnormal growths or skin changes, a visual exam of the cervix, and then an exam to evaluate the size and shape of the uterus and  ovaries.  And after 23 years of age, a rectal exam is indicated to check for rectal cancers. We will also provide age appropriate advice regarding health maintenance, like when you should have certain vaccines, screening for sexually transmitted diseases, bone density testing, colonoscopy, mammograms, etc.   MAMMOGRAMS:  All women over 40 years old should have a yearly mammogram. Many facilities now offer a "3D" mammogram, which may cost around $50 extra out of pocket. If possible,  we recommend you accept the option to have the 3D mammogram performed.  It both reduces the number of women who will be called back for extra views which then turn out to be normal, and it is better than the routine mammogram at detecting truly abnormal areas.    COLONOSCOPY:  Colonoscopy to screen for colon cancer is recommended for all women at age 50.  We know, you hate the idea of the prep.  We agree, BUT, having colon cancer and not knowing it is worse!!  Colon cancer so often starts as a polyp that can be seen and removed at colonscopy, which can quite literally save your life!  And if your first colonoscopy is normal and you have no family history of colon cancer, most women don't have to have it again for 10 years.  Once every ten years, you can do something that may end up saving your life, right?  We will be happy to help you get it scheduled when you are ready.    Be sure to check your insurance coverage so you understand how much it will cost.  It may be covered as a preventative service at no cost, but you should check your particular policy.     Exercise to Lose Weight Exercise and a healthy diet may help you lose weight. Your doctor may suggest specific exercises. EXERCISE IDEAS AND TIPS  Choose low-cost things you enjoy doing, such as walking, bicycling, or exercising to workout videos.  Take stairs instead of the elevator.  Walk during your lunch break.  Park your car further away from work or  school.  Go to a gym or an exercise class.  Start with 5 to 10 minutes of exercise each day. Build up to 30 minutes of exercise 4 to 6 days a week.  Wear shoes with good support and comfortable clothes.  Stretch before and after working out.  Work out until you breathe harder and your heart beats faster.  Drink extra water when you exercise.  Do not do so much that you hurt yourself, feel dizzy, or get very short of breath. Exercises that burn about 150 calories:  Running 1  miles in 15 minutes.  Playing volleyball for 45 to 60 minutes.  Washing and waxing a car for 45 to 60 minutes.  Playing touch football for 45 minutes.  Walking 1  miles in 35 minutes.  Pushing a stroller 1  miles in 30 minutes.  Playing basketball for 30 minutes.  Raking leaves for 30 minutes.  Bicycling 5 miles in 30 minutes.  Walking 2 miles in 30 minutes.  Dancing for 30 minutes.  Shoveling snow for 15 minutes.  Swimming laps for 20 minutes.  Walking up stairs for 15 minutes.  Bicycling 4 miles in 15 minutes.  Gardening for 30 to 45 minutes.  Jumping rope for 15 minutes.  Washing windows or floors for 45 to 60 minutes. Document Released: 05/28/2010 Document Revised: 07/18/2011 Document Reviewed: 05/28/2010 [employer] Patient Information 2015 [employer]. This information is not intended to replace advice given to you by your health care provider. Make sure you discuss any questions you have with your health care provider.  

## 2013-12-25 NOTE — Progress Notes (Signed)
GYNECOLOGY VISIT  PCP:   Referring provider:   HPI: 23 y.o.   Single  Caucasian  female   G0P0 with Patient's last menstrual period was 11/13/2013.   here for  Annual. Menses are late.  Not sexually active with female.     Gained 20 pounds in the last year.  Wants to test thyroid and do blood work.  Brother with thyroid issues.  Stopping smoking.   No new partner.  Has female partner. Decreased libido.  Takes Luvox and thinks this affects the libido.   Hgb:  Provider 1st Urine:  Negative ph: 6.0  GYNECOLOGIC HISTORY: Patient's last menstrual period was 11/13/2013. Sexually active:  yes Partner preference: female Contraception:   none Menopausal hormone therapy: no DES exposure:   no Blood transfusions:no    Sexually transmitted diseases:  no   GYN procedures and prior surgeries:no   Last mammogram:     never            Last pap and high risk HPV testing: 09/19/12 LSIL   History of abnormal pap smear:  yes   OB History   Grav Para Term Preterm Abortions TAB SAB Ect Mult Living   0                LIFESTYLE: Exercise:   walking             OTHER HEALTH MAINTENANCE: Tetanus/TDap:2010 HPV: completed series Influenza: no    Bone density:never Colonoscopy:never  Cholesterol check: no  Family History  Problem Relation Age of Onset  . ADD / ADHD Mother   . Depression Maternal Uncle   . Alcohol abuse Maternal Grandfather   . OCD Maternal Grandmother   . Thyroid disease Brother   . Hypertension Brother   . Hyperlipidemia Brother   . Diabetes Brother   . Hyperlipidemia Father   . Hypertension Father   . Heart attack Neg Hx     Patient Active Problem List   Diagnosis Date Noted  . Left knee pain 05/08/2013  . OCD (obsessive compulsive disorder) 06/08/2011   Past Medical History  Diagnosis Date  . ADHD (attention deficit hyperactivity disorder)   . Anxiety   . Obsessive-compulsive disorder   . Oppositional defiant disorder   . Dysmenorrhea      Past Surgical History  Procedure Laterality Date  . Wisdom tooth extraction      ALLERGIES: Penicillins  Current Outpatient Prescriptions  Medication Sig Dispense Refill  . fluvoxaMINE (LUVOX) 100 MG tablet TAKE 1.5 tablet daily (150 mg)  45 tablet  2  . HYDROcodone-acetaminophen (NORCO/VICODIN) 5-325 MG per tablet Take 1 tablet by mouth every 6 (six) hours as needed for moderate pain.       No current facility-administered medications for this visit.     ROS:  Pertinent items are noted in HPI.  History   Social History  . Marital Status: Single    Spouse Name: N/A    Number of Children: N/A  . Years of Education: N/A   Occupational History  . Not on file.   Social History Main Topics  . Smoking status: Current Every Day Smoker -- 1.00 packs/day for 2 years    Types: Cigarettes  . Smokeless tobacco: Never Used  . Alcohol Use: 3.0 oz/week    6 drink(s) per week     Comment: 3 alcoholic drinks per week  . Drug Use: No  . Sexual Activity: Yes     Comment: lesbian   Other  Topics Concern  . Not on file   Social History Narrative  . No narrative on file    PHYSICAL EXAMINATION:    BP 124/80  Pulse 68  Ht 5\' 2"  (1.575 m)  Wt 159 lb (72.122 kg)  BMI 29.07 kg/m2  LMP 11/13/2013   Wt Readings from Last 3 Encounters:  12/25/13 159 lb (72.122 kg)  12/03/13 145 lb (65.772 kg)  08/12/13 148 lb (67.132 kg)     Ht Readings from Last 3 Encounters:  12/25/13 5\' 2"  (1.575 m)  12/03/13 5\' 2"  (1.575 m)  05/07/13 5\' 2"  (1.575 m)    General appearance: alert, cooperative and appears stated age Head: Normocephalic, without obvious abnormality, atraumatic Neck: no adenopathy, supple, symmetrical, trachea midline and thyroid not enlarged, symmetric, no tenderness/mass/nodules Lungs: clear to auscultation bilaterally Breasts: Inspection negative, No nipple retraction or dimpling, No nipple discharge or bleeding, No axillary or supraclavicular adenopathy, Normal to  palpation without dominant masses Heart: regular rate and rhythm Abdomen: soft, non-tender; no masses,  no organomegaly Extremities: extremities normal, atraumatic, no cyanosis or edema Skin: Skin color, texture, turgor normal. No rashes or lesions Lymph nodes: Cervical, supraclavicular, and axillary nodes normal. No abnormal inguinal nodes palpated Neurologic: Grossly normal  Pelvic: External genitalia:  no lesions              Urethra:  normal appearing urethra with no masses, tenderness or lesions              Bartholins and Skenes: normal                 Vagina: normal appearing vagina with normal color and discharge, no lesions              Cervix: normal appearance              Pap and high risk HPV testing done: Yes.          Bimanual Exam:  Uterus:  uterus is normal size, shape, consistency and nontender                                      Adnexa: normal adnexa in size, nontender and no masses                                      Rectovaginal:  No.                                       ASSESSMENT  Normal gynecologic exam. Weight gain.  Skipped menses - possible due to weight gain.  LGSIL pap last year.  Decreased libido.  Side effect of Luvox.    PLAN  Mammogram recommended yearly starting at age 76. Pap smear and high risk HPV testing as above. Counseled on self breast exam,  Exercise, and weight loss.  Provera 10 mg po x 10 days.  Discussed side effects.  See lab orders: Yes.   Return annually or prn   An After Visit Summary was printed and given to the patient.

## 2013-12-25 NOTE — Telephone Encounter (Signed)
Pt was in the office to day. A fax from [facility] states that the pt is requesting a 90 day supply  Please advise

## 2013-12-26 LAB — IPS PAP TEST WITH REFLEX TO HPV

## 2013-12-26 LAB — PROLACTIN: Prolactin: 9.9 ng/mL

## 2013-12-30 ENCOUNTER — Telehealth: Payer: Self-pay | Admitting: Obstetrics and Gynecology

## 2013-12-30 NOTE — Telephone Encounter (Signed)
Pt calling for results

## 2013-12-30 NOTE — Telephone Encounter (Signed)
Per result note, labs were normal and sent to patient to patient thru My Chart. Return call to patient,  LMTCB.

## 2013-12-30 NOTE — Telephone Encounter (Signed)
Patient returned call. Has not been able to log onto MyChart. Given MyChart message from Dr Quincy Simmonds that all labs normal, numbers for cholesterol profile given. Instructed to begin the Provera as directed by dr Quincy Simmonds to reset menstrual cycles. Advised can take up to two weeks from last pill for menses to start and call if no menses after two full weeks. Patient agreeable.  Routing to provider for final review. Patient agreeable to disposition. Will close encounter

## 2014-02-03 ENCOUNTER — Telehealth (HOSPITAL_COMMUNITY): Payer: Self-pay | Admitting: *Deleted

## 2014-02-03 NOTE — Telephone Encounter (Signed)
PT needs prescription refill for Luvox 150 mg.

## 2014-02-04 ENCOUNTER — Ambulatory Visit (HOSPITAL_COMMUNITY): Payer: Self-pay | Admitting: Psychiatry

## 2014-02-04 ENCOUNTER — Other Ambulatory Visit (HOSPITAL_COMMUNITY): Payer: Self-pay | Admitting: Physician Assistant

## 2014-02-04 DIAGNOSIS — F429 Obsessive-compulsive disorder, unspecified: Secondary | ICD-10-CM

## 2014-02-04 MED ORDER — FLUVOXAMINE MALEATE 100 MG PO TABS: ORAL_TABLET | ORAL | Status: DC

## 2014-02-04 NOTE — Telephone Encounter (Signed)
Medication refilled x 1 due to need for appointment to be rescheduled. Jennifer Singh. Kanishk Stroebel RPAC 7:06 PM 02/04/2014

## 2014-02-05 NOTE — Telephone Encounter (Signed)
LMOM that Rx had been called in. Marlane Hatcher. Ellinor Test RPAC 9:19 AM 02/05/2014

## 2014-03-11 ENCOUNTER — Encounter (HOSPITAL_COMMUNITY): Payer: Self-pay | Admitting: Psychiatry

## 2014-03-11 ENCOUNTER — Ambulatory Visit (INDEPENDENT_AMBULATORY_CARE_PROVIDER_SITE_OTHER): Payer: BC Managed Care – PPO | Admitting: Psychiatry

## 2014-03-11 VITALS — BP 115/73 | HR 96 | Ht 62.0 in | Wt 163.0 lb

## 2014-03-11 DIAGNOSIS — F42 Obsessive-compulsive disorder: Secondary | ICD-10-CM

## 2014-03-11 DIAGNOSIS — F429 Obsessive-compulsive disorder, unspecified: Secondary | ICD-10-CM

## 2014-03-11 MED ORDER — FLUVOXAMINE MALEATE 100 MG PO TABS: ORAL_TABLET | ORAL | Status: DC

## 2014-03-11 MED ORDER — HYDROXYZINE HCL 25 MG PO TABS: ORAL_TABLET | ORAL | Status: DC

## 2014-03-13 ENCOUNTER — Ambulatory Visit (HOSPITAL_COMMUNITY): Payer: Self-pay | Admitting: Psychiatry

## 2014-03-13 NOTE — Progress Notes (Signed)
Patient ID: Jennifer Singh, female   DOB: 1990/09/24, 23 y.o.   MRN: [id number]   [facility] [address] Progress Note  Jennifer Singh [id number] 23 y.o.  03/11/2014 12:58 PM  Chief Complaint:  HPI Comments: Jennifer Singh is  a 23 y/o female with a past psychiatric history significant for Obsessive Compulsive Disorder, ADHD. The patient is referred to psychiatric services for  medication management.    . Location: She report she is tolerating Luvox. Luvox medication was increased to 150 mg.  She describes more manageable compulsions and less checking.   . Quality: The patient reports that her main stressors is work.   In the area of affective symptoms, patient appears mildly anxious. Patient endorses current suicidal ideation, intent, or plan. Patient denies current homicidal ideation, intent, or plan. Patient denies auditory hallucinations. Patient denies visual hallucinations. Patient denies symptoms of paranoia. Patient states sleep is good, with approximately 6 hours of sleep per night. Appetite is increase. Energy level is high. Patient denies symptoms of anhedonia. Patient denies hopelessness, helplessness, or guilt.    . Severity: Depression: 7/10 (0=Very depressed; 5=Neutral; 10=Very Happy)  Anxiety has decreased to  5/10 (0=no anxiety; 5= moderate/tolerable anxiety; 10= panic attacks)  . Duration-Since she was 6 years  . Timing: Throughout the day  . Context; Obsessive thoughts, and compulsions  . Modifying factors: None  . Associated signs and symptoms : Denies any/ No reported recent episodes consistent with mania, particularly decreased need for sleep with increased energy, grandiosity, impulsivity, hyperverbal and pressured speech, or increased productivity. Denies any recent symptoms consistent with psychosis, particularly auditory or visual hallucinations, thought broadcasting/insertion/withdrawal, or ideas of reference.  Denies any history of trauma or symptoms  consistent with PTSD such as flashbacks, nightmares, hypervigilance, feelings of numbness or inability to connect with others.   She feels her inattention and anxiety is better, did not inquire for other ADHD medication.      Past Medical Family, Social History:  Past Medical History  Diagnosis Date  . ADHD (attention deficit hyperactivity disorder)   . Anxiety   . Obsessive-compulsive disorder   . Oppositional defiant disorder   . Dysmenorrhea    Family History  Problem Relation Age of Onset  . ADD / ADHD Mother   . Depression Maternal Uncle   . Alcohol abuse Maternal Grandfather   . OCD Maternal Grandmother   . Thyroid disease Brother   . Hypertension Brother   . Hyperlipidemia Brother   . Diabetes Brother   . Hyperlipidemia Father   . Hypertension Father   . Heart attack Neg Hx    History   Social History  . Marital Status: Single    Spouse Name: N/A    Number of Children: N/A  . Years of Education: N/A   Social History Main Topics  . Smoking status: Current Every Day Smoker -- 1.00 packs/day for 2 years    Types: Cigarettes  . Smokeless tobacco: Never Used  . Alcohol Use: 3.0 oz/week    6 drink(s) per week     Comment: 3 alcoholic drinks per week  . Drug Use: No  . Sexual Activity: Yes     Comment: lesbian   Other Topics Concern  . None   Social History Narrative    Outpatient Encounter Prescriptions as of 03/11/2014  Medication Sig  . fluvoxaMINE (LUVOX) 100 MG tablet TAKE 1.5 tablet daily (150 mg)  . [DISCONTINUED] fluvoxaMINE (LUVOX) 100 MG tablet TAKE 1.5 tablet daily (150 mg)  .  hydrOXYzine (ATARAX/VISTARIL) 25 MG tablet Take one if needed  for anxiety or stress.  . [DISCONTINUED] HYDROcodone-acetaminophen (NORCO/VICODIN) 5-325 MG per tablet Take 1 tablet by mouth every 6 (six) hours as needed for moderate pain.  . [DISCONTINUED] medroxyPROGESTERone (PROVERA) 10 MG tablet Take 1 tablet (10 mg total) by mouth daily.      Review of  Systems: Review of Systems  Constitutional: Negative for fever and weight loss.  Respiratory: Negative for cough.   Cardiovascular: Negative for chest pain.  Gastrointestinal: Negative for nausea.  Skin: Negative for rash.  Neurological: Negative for headaches.  Psychiatric/Behavioral: Negative for suicidal ideas, hallucinations and substance abuse.     Neurologic: Headache: Negative Seizure: Negative Paresthesias: Negative Physical Exam: Constitutional:  BP 115/73 mmHg  Pulse 96  Ht 5\' 2"  (1.575 m)  Wt 163 lb (73.936 kg)  BMI 29.81 kg/m2  General Appearance: alert, oriented, no acute distress  Musculoskeletal: Gait & Station: normal Patient leans: Right  Psychiatric Specialty Exam:  Psychiatric Specialty Exam:   General Appearance: Casual and Well Groomed  Eye Contact::  Good  Speech:  Clear and Coherent and Normal Rate  Volume:  Normal  Mood:  "good"  Affect:  Appropriate, Congruent and Full Range  Thought Process:  Coherent, Goal Directed, Linear and Logical  Orientation:  Full (Time, Place, and Person)  Thought Content:  WDL  Suicidal Thoughts:  No  Homicidal Thoughts:  No  Memory:  Immediate;   Good Recent;   Good Remote;   Good  Judgement:  Fair  Insight:  Fair  Psychomotor Activity:  Normal  Concentration:  Fair  Recall:  Good  Akathisia:  No  Handed:  Right  AIMS (if indicated):  Not indicated  Assets:  Communication Skills   language intact  fund of knowledge is average       Assessment: Axis I: Obsessive Compulsive Disorder-mild    Plan:   Plan of Care:  PLAN:  1. Affirm with the patient that the medications are taken as ordered. Patient  expressed understanding of how their medications were to be used.    Laboratory:   No labs warranted at this time.   Psychotherapy: Therapy: brief supportive therapy provided.  Discussed psychosocial stressors in detail. More than 50% of the visit was spent on individual therapy/counseling.    Medications:  Continue the following psychiatric medications as written prior to this appointment with the following changes::  a)  Luvox to 150 mg daily. -Risks and benefits, side effects and alternatives discussed with patient, he/she was given an opportunity to ask questions about his/her medication, illness, and treatment. All current psychiatric medications have been reviewed and discussed with the patient and adjusted as clinically appropriate. The patient has been provided an accurate and updated list of the medications being now prescribed.   Routine PRN Medications:  Negative  Consultations: The patient was encouraged to keep all PCP and specialty clinic appointments.   Safety Concerns:   Patient told to call clinic if any problems occur. Patient advised to go to  ER  if s/he should develop SI/HI, side effects, or if symptoms worsen. Has crisis numbers to call if needed.    Other:   8. Patient was instructed to return to clinic in 1 months.  9. The patient was advised to call and cancel their mental health appointment within 24 hours of the appointment, if they are unable to keep the appointment, as well as the three no show and termination from clinic policy. 10.  The patient expressed understanding of the plan and agrees with the above. She will inquire often takes from her primary care provider. As of now she feels comfortable with Luvox. I would recommend not to start her back on a stimulant medication considering her anxiety level.   Time Spent: 25 minutes  Merian Capron, MD 03/13/2014

## 2014-05-12 ENCOUNTER — Other Ambulatory Visit (HOSPITAL_COMMUNITY): Payer: Self-pay | Admitting: Psychiatry

## 2014-05-12 ENCOUNTER — Telehealth (HOSPITAL_COMMUNITY): Payer: Self-pay

## 2014-05-12 ENCOUNTER — Telehealth (HOSPITAL_COMMUNITY): Payer: Self-pay | Admitting: *Deleted

## 2014-05-12 DIAGNOSIS — F429 Obsessive-compulsive disorder, unspecified: Secondary | ICD-10-CM

## 2014-05-12 MED ORDER — FLUVOXAMINE MALEATE 100 MG PO TABS: ORAL_TABLET | ORAL | Status: DC

## 2014-05-12 NOTE — Telephone Encounter (Signed)
PT needs a refill for Luvox

## 2014-05-12 NOTE — Telephone Encounter (Signed)
PT needs a refill on Luvox she will be coming to her appt. Next week.

## 2014-05-12 NOTE — Telephone Encounter (Signed)
Per Dr. De Nurse, pt is authorized for 1 refill for Luvox 100mg , Qty 45. Pt has an appt on 1/15.

## 2014-05-13 ENCOUNTER — Ambulatory Visit (HOSPITAL_COMMUNITY): Payer: Self-pay | Admitting: Psychiatry

## 2014-05-23 ENCOUNTER — Ambulatory Visit (HOSPITAL_COMMUNITY): Payer: Self-pay | Admitting: Psychiatry

## 2014-09-29 ENCOUNTER — Encounter (HOSPITAL_BASED_OUTPATIENT_CLINIC_OR_DEPARTMENT_OTHER): Payer: Self-pay | Admitting: *Deleted

## 2014-09-29 ENCOUNTER — Emergency Department (HOSPITAL_BASED_OUTPATIENT_CLINIC_OR_DEPARTMENT_OTHER)
Admission: EM | Admit: 2014-09-29 | Discharge: 2014-09-30 | Disposition: A | Discharge status: Home / Self Care | Payer: BC Managed Care – PPO | Attending: Emergency Medicine | Admitting: Emergency Medicine

## 2014-09-29 ENCOUNTER — Emergency Department (HOSPITAL_BASED_OUTPATIENT_CLINIC_OR_DEPARTMENT_OTHER): Payer: BC Managed Care – PPO

## 2014-09-29 DIAGNOSIS — Y9389 Activity, other specified: Secondary | ICD-10-CM | POA: Insufficient documentation

## 2014-09-29 DIAGNOSIS — F419 Anxiety disorder, unspecified: Secondary | ICD-10-CM | POA: Diagnosis not present

## 2014-09-29 DIAGNOSIS — Z720 Tobacco use: Secondary | ICD-10-CM | POA: Insufficient documentation

## 2014-09-29 DIAGNOSIS — S61213A Laceration without foreign body of left middle finger without damage to nail, initial encounter: Secondary | ICD-10-CM

## 2014-09-29 DIAGNOSIS — F909 Attention-deficit hyperactivity disorder, unspecified type: Secondary | ICD-10-CM | POA: Diagnosis not present

## 2014-09-29 DIAGNOSIS — Y9289 Other specified places as the place of occurrence of the external cause: Secondary | ICD-10-CM | POA: Diagnosis not present

## 2014-09-29 DIAGNOSIS — Z87448 Personal history of other diseases of urinary system: Secondary | ICD-10-CM | POA: Diagnosis not present

## 2014-09-29 DIAGNOSIS — W293XXA Contact with powered garden and outdoor hand tools and machinery, initial encounter: Secondary | ICD-10-CM | POA: Diagnosis not present

## 2014-09-29 DIAGNOSIS — S62633B Displaced fracture of distal phalanx of left middle finger, initial encounter for open fracture: Secondary | ICD-10-CM | POA: Diagnosis not present

## 2014-09-29 DIAGNOSIS — S6992XA Unspecified injury of left wrist, hand and finger(s), initial encounter: Secondary | ICD-10-CM | POA: Diagnosis present

## 2014-09-29 DIAGNOSIS — Z8669 Personal history of other diseases of the nervous system and sense organs: Secondary | ICD-10-CM | POA: Insufficient documentation

## 2014-09-29 DIAGNOSIS — Y998 Other external cause status: Secondary | ICD-10-CM | POA: Diagnosis not present

## 2014-09-29 DIAGNOSIS — Z880 Allergy status to penicillin: Secondary | ICD-10-CM | POA: Insufficient documentation

## 2014-09-29 DIAGNOSIS — S62631B Displaced fracture of distal phalanx of left index finger, initial encounter for open fracture: Secondary | ICD-10-CM

## 2014-09-29 HISTORY — DX: Central auditory processing disorder: H93.25

## 2014-09-29 MED ORDER — CEFAZOLIN SODIUM 1-5 GM-% IV SOLN
1.0000 g | Freq: Once | INTRAVENOUS | Status: AC
  Administered 2014-09-29: 1 g via INTRAVENOUS
  Filled 2014-09-29: qty 50

## 2014-09-29 MED ORDER — LIDOCAINE HCL (PF) 1 % IJ SOLN
INTRAMUSCULAR | Status: AC
  Administered 2014-09-30: 5 mL
  Filled 2014-09-29: qty 5

## 2014-09-29 NOTE — ED Notes (Addendum)
Lac to left index finger w hedge trimmer,  Bleeding controlled

## 2014-09-29 NOTE — ED Provider Notes (Addendum)
CSN: [id number]     Arrival date & time 09/29/14  2043 History  This chart was scribed for Shanon Rosser, MD by Chester Holstein, ED Scribe. This patient was seen in room MH07/MH07 and the patient's care was started at 11:14 PM.    Chief Complaint  Patient presents with  . Finger Injury   The history is provided by the patient. No language interpreter was used.    HPI HPI Comments: Jennifer Singh is a 24 y.o. female who presents to the Emergency Department complaining of laceration to left index finger and left middle finger with onset around 7:30 PM. Pt was using a hedge trimmer and accidentally cut herself. Pain is minimal but pressure aggravates the pain. Bleeding is controlled. There is edematous tissue protruding from the wound. Pt is UTD on tetanus. Pt denies any numbness or functional deficit.   Past Medical History  Diagnosis Date  . ADHD (attention deficit hyperactivity disorder)   . Anxiety   . Obsessive-compulsive disorder   . Oppositional defiant disorder   . Dysmenorrhea   . Central auditory processing disorder (CAPD)    Past Surgical History  Procedure Laterality Date  . Wisdom tooth extraction     Family History  Problem Relation Age of Onset  . ADD / ADHD Mother   . Depression Maternal Uncle   . Alcohol abuse Maternal Grandfather   . OCD Maternal Grandmother   . Thyroid disease Brother   . Hypertension Brother   . Hyperlipidemia Brother   . Diabetes Brother   . Hyperlipidemia Father   . Hypertension Father   . Heart attack Neg Hx    History  Substance Use Topics  . Smoking status: Current Every Day Smoker -- 1.00 packs/day for 2 years    Types: Cigarettes  . Smokeless tobacco: Never Used  . Alcohol Use: 3.0 oz/week    6 Standard drinks or equivalent per week     Comment: 3 alcoholic drinks per week   OB History    Gravida Para Term Preterm AB TAB SAB Ectopic Multiple Living   0              Review of Systems A complete 10 system review of systems  was obtained and all systems are negative except as noted in the HPI and PMH.     Allergies  Penicillins  Home Medications   Prior to Admission medications   Medication Sig Start Date End Date Taking? Authorizing Provider  fluvoxaMINE (LUVOX) 100 MG tablet TAKE 1.5 tablet daily (150 mg) 05/12/14   Merian Capron, MD   BP 133/82 mmHg  Pulse 104  Temp(Src) 98.8 F (37.1 C) (Oral)  Resp 18  Ht 5\' 1"  (1.549 m)  Wt 155 lb (70.308 kg)  BMI 29.30 kg/m2  SpO2 100%  LMP 09/08/2014   Physical Exam General: Well-developed, well-nourished female in no acute distress; appearance consistent with age of record HENT: normocephalic; atraumatic Eyes: pupils equal, round and reactive to light; extraocular muscles intact Neck: supple Heart: regular rate and rhythm Lungs: clear to auscultation bilaterally Abdomen: soft; nondistended Extremities: No deformity; full range of motion; pulses normal; laceration of the distal phalanx of the left index finger with protrusion of edematous subcutaneous tissue; NVI; tendon function appears intact at the DIP joint; small laceration of middle phalanx of left middle finger with intact sensation and function Neurologic: Awake, alert and oriented; motor function intact in all extremities and symmetric; no facial droop Skin: Warm and dry Psychiatric: Normal  mood and affect     ED Course  Procedures (including critical care time) DIAGNOSTIC STUDIES: Oxygen Saturation is 100% on room air, normal by my interpretation.    COORDINATION OF CARE: 11:31 PM Discussed treatment plan with patient at beside, the patient agrees with the plan and has no further questions at this time.   11:40 PM-Consult complete with hand surgery. Patient case explained and discussed with Dr. Lenon Curt  who agreed with IV Ancef, advised loose approximation and will see in the office tomorrow. Call ended at 11:41 PM   LACERATION REPAIR Performed by: Wynetta Fines Authorized by: Wynetta Fines Consent: Verbal consent obtained. Risks and benefits: risks, benefits and alternatives were discussed Consent given by: patient Patient identity confirmed: provided demographic data Prepped and Draped in normal sterile fashion Wound explored and debrided  Laceration Location: Left index finger  Laceration Length: 2.5 cm  No Foreign Bodies seen or palpated  Anesthesia: local infiltration  Local anesthetic: lidocaine 1 % without epinephrine  Anesthetic total: 1.5 ml  Irrigation method: syringe Amount of cleaning: Copious   Skin closure: 4-0 Prolene  Number of sutures: 3   Technique: Simple interrupted, loosely approximated   Patient tolerance: Patient tolerated the procedure well with no immediate complications.     Nursing notes and vitals signs, including pulse oximetry, reviewed.  Summary of this visit's results, reviewed by myself:  Imaging Studies: Dg Finger Index Left  09/29/2014   CLINICAL DATA:  Finger laceration with electric hedge tremors. Initial encounter.  EXAM: LEFT INDEX FINGER 2+V  COMPARISON:  None.  FINDINGS: There is an open mildly displaced intra-articular fracture involving the radial base of the left second distal phalanx. There is associated significant soft tissue injury. The middle phalanx appears intact. No evidence of foreign body or dislocation.  IMPRESSION: Open intra-articular fracture involving the base of the second distal phalanx as described.   Electronically Signed   By: Richardean Sale M.D.   On: 09/29/2014 21:45   I personally performed the services described in this documentation, which was scribed in my presence. The recorded information has been reviewed and is accurate.    Shanon Rosser, MD 09/30/14 [id number]  Shanon Rosser, MD 09/30/14 [id number]

## 2014-09-29 NOTE — ED Notes (Signed)
Left index finger laceration by hedge trimmer.

## 2014-09-30 MED ORDER — IBUPROFEN 800 MG PO TABS
800.0000 mg | ORAL_TABLET | Freq: Once | ORAL | Status: AC
  Administered 2014-09-30: 800 mg via ORAL
  Filled 2014-09-30: qty 1

## 2014-09-30 NOTE — Discharge Instructions (Signed)
You have an open fracture of her left index finger which but she would risk of infection, including infection of the bone. You have been treated with anti-biotics and wound irrigation in the ED. Please contact Dr. Brennan Bailey office this morning after 8 AM to arrange for follow-up visit. Tell his office staff that Dr. Florina Ou spoke with Dr. Lenon Curt, and Dr. Lenon Curt requested that she be seen today.

## 2014-11-27 ENCOUNTER — Other Ambulatory Visit (HOSPITAL_COMMUNITY): Payer: Self-pay | Admitting: Psychiatry

## 2014-11-27 ENCOUNTER — Telehealth (HOSPITAL_COMMUNITY): Payer: Self-pay | Admitting: *Deleted

## 2014-11-27 DIAGNOSIS — F429 Obsessive-compulsive disorder, unspecified: Secondary | ICD-10-CM

## 2014-11-27 MED ORDER — FLUVOXAMINE MALEATE 100 MG PO TABS: ORAL_TABLET | ORAL | Status: DC

## 2014-11-27 NOTE — Telephone Encounter (Signed)
PT called for a refill for Luvox 100mg . Per Dr. De Nurse, pt is authorized for a refill for Luvox 100mg , Qty 45.  Prescription was sent to pharmacy. Called and informed pt of prescription. Pt states and shows understanding.

## 2014-11-27 NOTE — Telephone Encounter (Signed)
Rx request was sent to [facility] per pt request on 7/21

## 2014-12-17 ENCOUNTER — Ambulatory Visit (HOSPITAL_COMMUNITY): Payer: Self-pay | Admitting: Psychiatry

## 2014-12-23 ENCOUNTER — Ambulatory Visit (HOSPITAL_COMMUNITY): Payer: Self-pay | Admitting: Psychiatry

## 2015-01-28 ENCOUNTER — Telehealth (HOSPITAL_COMMUNITY): Payer: Self-pay | Admitting: *Deleted

## 2015-01-28 NOTE — Telephone Encounter (Signed)
Received medication request from [facility] for Fluvoxamine Maleate 100mg . Per Dr. De Nurse, pt will need to schedule an appointment with clinic. Pt was last seen 03/11/14. L/M for pt to return call to clinic to schedule an appt.

## 2015-02-03 ENCOUNTER — Ambulatory Visit (INDEPENDENT_AMBULATORY_CARE_PROVIDER_SITE_OTHER): Payer: BC Managed Care – PPO | Admitting: Psychiatry

## 2015-02-03 ENCOUNTER — Telehealth (HOSPITAL_COMMUNITY): Payer: Self-pay

## 2015-02-03 ENCOUNTER — Encounter (HOSPITAL_COMMUNITY): Payer: Self-pay | Admitting: Psychiatry

## 2015-02-03 DIAGNOSIS — F42 Obsessive-compulsive disorder: Secondary | ICD-10-CM | POA: Diagnosis not present

## 2015-02-03 DIAGNOSIS — F4321 Adjustment disorder with depressed mood: Secondary | ICD-10-CM

## 2015-02-03 DIAGNOSIS — F902 Attention-deficit hyperactivity disorder, combined type: Secondary | ICD-10-CM

## 2015-02-03 DIAGNOSIS — F429 Obsessive-compulsive disorder, unspecified: Secondary | ICD-10-CM

## 2015-02-03 MED ORDER — FLUVOXAMINE MALEATE 100 MG PO TABS: ORAL_TABLET | ORAL | Status: DC

## 2015-02-03 MED ORDER — ARIPIPRAZOLE 5 MG PO TABS: 5.0000 mg | ORAL_TABLET | Freq: Every day | ORAL | Status: DC

## 2015-02-03 NOTE — Progress Notes (Signed)
Patient ID: Jennifer Singh, female   DOB: April 06, 1991, 24 y.o.   MRN: [id number]   Jennifer Singh [id number] Progress Note  Jennifer Singh [id number] 24 y.o.  03/11/2014 10:46 AM  Chief Complaint:  HPI Comments: Ms. Jennifer Singh is  a 24 y/o female with a past psychiatric history significant for Obsessive Compulsive Disorder, ADHD. The patient is referred to psychiatric services for  medication management.    . Location: She report she is tolerating Luvox. Luvox medication was increased to 150 mg.  She describes are compulsions of checking things have gone worse.  Job stress and unable to focus has made her anxiety worse.  The group practice; she lost her dad February 2016. He was suffering from carcinoma of the bladder. Family is adjusting but still in grief process  . Quality: The patient reports that her main stressors is work.   . Severity: Depression: 5/10 (0=Very depressed; 5=Neutral; 10=Very Happy)  Anxiety has increased to 6/10 (0=no anxiety; 5= moderate/tolerable anxiety; 10= panic attacks)  . Duration-Since she was 6 years  . Timing: Throughout the day  . Context; Obsessive thoughts, and compulsions. Fathers death.   . Modifying factors: None  . Associated signs and symptoms : Denies any/ No reported recent episodes consistent with mania, particularly decreased need for sleep with increased energy, grandiosity, impulsivity, hyperverbal and pressured speech, or increased productivity. Denies any recent symptoms consistent with psychosis, particularly auditory or visual hallucinations, thought broadcasting/insertion/withdrawal, or ideas of reference.  Denies any history of trauma or symptoms consistent with PTSD such as flashbacks, nightmares, hypervigilance, feelings of numbness or inability to connect with others.        Past Medical Family, Social History:  Past Medical History  Diagnosis Date  . ADHD (attention deficit hyperactivity disorder)   . Anxiety   .  Obsessive-compulsive disorder   . Oppositional defiant disorder   . Dysmenorrhea   . Central auditory processing disorder (CAPD)    Family History  Problem Relation Age of Onset  . ADD / ADHD Mother   . Depression Maternal Uncle   . Alcohol abuse Maternal Grandfather   . OCD Maternal Grandmother   . Thyroid disease Brother   . Hypertension Brother   . Hyperlipidemia Brother   . Diabetes Brother   . Hyperlipidemia Father   . Hypertension Father   . Heart attack Neg Hx    Social History   Social History  . Marital Status: Single    Spouse Name: N/A  . Number of Children: N/A  . Years of Education: N/A   Social History Main Topics  . Smoking status: Current Every Day Smoker -- 1.00 packs/day for 2 years    Types: Cigarettes  . Smokeless tobacco: Never Used  . Alcohol Use: 3.0 oz/week    6 Standard drinks or equivalent per week     Comment: 3 alcoholic drinks per week  . Drug Use: No  . Sexual Activity: Yes     Comment: lesbian   Other Topics Concern  . None   Social History Narrative    Outpatient Encounter Prescriptions as of 02/03/2015  Medication Sig  . ARIPiprazole (ABILIFY) 5 MG tablet Take 1 tablet (5 mg total) by mouth daily.  . fluvoxaMINE (LUVOX) 100 MG tablet TAKE 2 a day,  total of (200mg )  . [DISCONTINUED] fluvoxaMINE (LUVOX) 100 MG tablet TAKE 1.5 tablet daily (150 mg)   No facility-administered encounter medications on file as of 02/03/2015.      Review of  Systems: Review of Systems  Constitutional: Negative for fever and weight loss.  Respiratory: Negative for cough.   Cardiovascular: Negative for chest pain.  Gastrointestinal: Negative for nausea.  Skin: Negative for rash.  Neurological: Negative for tremors and headaches.  Psychiatric/Behavioral: Negative for suicidal ideas, hallucinations and substance abuse. The patient is nervous/anxious.      Neurologic: Headache: Negative Seizure: Negative Paresthesias: Negative Physical  Exam: Constitutional:  There were no vitals taken for this visit.  General Appearance: alert, oriented, no acute distress  Musculoskeletal: Gait & Station: normal Patient leans: Right  Psychiatric Specialty Exam:  Psychiatric Specialty Exam:   General Appearance: Casual and Well Groomed  Eye Contact::  Good  Speech:  Clear and Coherent and Normal Rate  Volume:  Normal  Mood:  Somewhat dysphoric and anxious  Affect:  Appropriate, Congruent and Full Range  Thought Process:  Coherent, Goal Directed, Linear and Logical  Orientation:  Full (Time, Place, and Person)  Thought Content:  WDL  Suicidal Thoughts:  No  Homicidal Thoughts:  No  Memory:  Immediate;   Good Recent;   Good Remote;   Good  Judgement:  Fair  Insight:  Fair  Psychomotor Activity:  Normal  Concentration:  Fair  Recall:  Good  Akathisia:  No  Handed:  Right  AIMS (if indicated):  Not indicated  Assets:  Communication Skills   language intact  fund of knowledge is average       Assessment: Axis I: Obsessive Compulsive Disorder-. ADHD. Grief   Plan:   Plan of Care:  PLAN:  1. Affirm with the patient that the medications are taken as ordered. Patient  expressed understanding of how their medications were to be used.    Laboratory:   No labs warranted at this time.   Psychotherapy: Therapy: brief supportive therapy provided.  Discussed psychosocial stressors in detail. More than 50% of the visit was spent on individual therapy/counseling.   Medications:  Continue the following psychiatric medications as written prior to this appointment with the following changes::  OCD: Increase Luvox to 100mg  2 tablets. Grief: therapy provided and to work on distraction when depressed. Add abilify 5mg .  for depression.  Risk side effects again explained.  ADHD: will manage depression first . -Risks and benefits, side effects and alternatives discussed with patient, he/she was given an opportunity to ask  questions about his/her medication, illness, and treatment. All current psychiatric medications have been reviewed and discussed with the patient and adjusted as clinically appropriate. The patient has been provided an accurate and updated list of the medications being now prescribed.   Routine PRN Medications:  Negative  Consultations: The patient was encouraged to keep all PCP and specialty clinic appointments.   Safety Concerns:   Patient told to call clinic if any problems occur. Patient advised to go to  ER  if s/he should develop SI/HI, side effects, or if symptoms worsen. Has crisis numbers to call if needed.    Other:   8. Patient was instructed to return to clinic in 1 months.  9. The patient was advised to call and cancel their mental Singh appointment within 24 hours of the appointment, if they are unable to keep the appointment, as well as the three no show and termination from clinic policy. 10. The patient expressed understanding of the plan and agrees with the above.   Time Spent: 25 minutes  Merian Capron, MD 02/03/2015

## 2015-02-05 NOTE — Telephone Encounter (Signed)
Pt would like to speak with Dr. De Nurse. Pt states the cost of Abilify is too expensive and would like to try another medication. Please call to advise.

## 2015-02-05 NOTE — Telephone Encounter (Signed)
Please bring list of any med she has used before. Schedule appointment for a new med if cannot afford abilify

## 2015-02-05 NOTE — Telephone Encounter (Signed)
L/M for pt to schedule appt for medication management.

## 2015-02-13 ENCOUNTER — Telehealth (HOSPITAL_COMMUNITY): Payer: Self-pay | Admitting: *Deleted

## 2015-02-13 MED ORDER — LAMOTRIGINE 25 MG PO TABS: 25.0000 mg | ORAL_TABLET | Freq: Every day | ORAL | Status: DC

## 2015-02-13 NOTE — Telephone Encounter (Signed)
Pt states she is unable to afford the cost of Abilify and would like to try a new medication. Please call to advise.

## 2015-02-13 NOTE — Telephone Encounter (Signed)
abilify stopped as she was not able to afford it. Added lamictal 25mg  as mood stabilizer and informed med sent to pharmacy on file. She is awared of side effects and risk.

## 2015-03-02 ENCOUNTER — Telehealth (HOSPITAL_COMMUNITY): Payer: Self-pay | Admitting: *Deleted

## 2015-03-02 ENCOUNTER — Ambulatory Visit (HOSPITAL_COMMUNITY): Payer: Self-pay | Admitting: Psychiatry

## 2015-03-02 DIAGNOSIS — F429 Obsessive-compulsive disorder, unspecified: Secondary | ICD-10-CM

## 2015-03-02 MED ORDER — LAMOTRIGINE 25 MG PO TABS: 25.0000 mg | ORAL_TABLET | Freq: Two times a day (BID) | ORAL | Status: DC

## 2015-03-02 MED ORDER — FLUVOXAMINE MALEATE 100 MG PO TABS: ORAL_TABLET | ORAL | Status: DC

## 2015-03-02 NOTE — Telephone Encounter (Signed)
Pt called for a refill for Lamictal 25mg  and Luvox 100mg . Per Dr. De Nurse, pt is authorized for a refill for Lamictal 25mg , #60 and Luvox 100mg , #60. Prescriptions were sent to pharmacy. Pt has a f/u appt on 04/01/15. Called and informed pt of prescription status. Pt verbalizes understanding.

## 2015-04-01 ENCOUNTER — Ambulatory Visit (HOSPITAL_COMMUNITY): Payer: BC Managed Care – PPO | Admitting: Psychiatry

## 2015-04-08 ENCOUNTER — Telehealth (HOSPITAL_COMMUNITY): Payer: Self-pay

## 2015-04-08 DIAGNOSIS — F429 Obsessive-compulsive disorder, unspecified: Secondary | ICD-10-CM

## 2015-04-08 MED ORDER — LAMOTRIGINE 25 MG PO TABS: 25.0000 mg | ORAL_TABLET | Freq: Two times a day (BID) | ORAL | Status: DC

## 2015-04-08 MED ORDER — FLUVOXAMINE MALEATE 100 MG PO TABS: ORAL_TABLET | ORAL | Status: DC

## 2015-04-08 NOTE — Telephone Encounter (Signed)
Pt called for a refill for Lamictal and Luvox. Per Dr. De Nurse, pt is authorized for a refill for Lamictal 25mg ,#60 and Luvox 100mg ,#60. Prescriptions were sent to [facility]. Pt has a f/u appt on 05/08/15. Called and informed pt of prescription status. Pt verbalizes understanding.

## 2015-05-08 ENCOUNTER — Ambulatory Visit (INDEPENDENT_AMBULATORY_CARE_PROVIDER_SITE_OTHER): Payer: BC Managed Care – PPO | Admitting: Psychiatry

## 2015-05-08 ENCOUNTER — Encounter: Payer: Self-pay | Admitting: Physician Assistant

## 2015-05-08 ENCOUNTER — Encounter (HOSPITAL_COMMUNITY): Payer: Self-pay | Admitting: Psychiatry

## 2015-05-08 ENCOUNTER — Ambulatory Visit (INDEPENDENT_AMBULATORY_CARE_PROVIDER_SITE_OTHER): Payer: BC Managed Care – PPO | Admitting: Physician Assistant

## 2015-05-08 VITALS — BP 126/64 | HR 95 | Ht 62.0 in | Wt 173.0 lb

## 2015-05-08 VITALS — BP 118/70 | HR 101 | Temp 98.5°F | Ht 62.0 in | Wt 173.0 lb

## 2015-05-08 DIAGNOSIS — J4521 Mild intermittent asthma with (acute) exacerbation: Secondary | ICD-10-CM | POA: Diagnosis not present

## 2015-05-08 DIAGNOSIS — J208 Acute bronchitis due to other specified organisms: Secondary | ICD-10-CM

## 2015-05-08 DIAGNOSIS — F4321 Adjustment disorder with depressed mood: Secondary | ICD-10-CM

## 2015-05-08 DIAGNOSIS — F902 Attention-deficit hyperactivity disorder, combined type: Secondary | ICD-10-CM | POA: Diagnosis not present

## 2015-05-08 DIAGNOSIS — F429 Obsessive-compulsive disorder, unspecified: Secondary | ICD-10-CM | POA: Diagnosis not present

## 2015-05-08 MED ORDER — PREDNISONE 20 MG PO TABS: ORAL_TABLET | ORAL | Status: DC

## 2015-05-08 MED ORDER — AZITHROMYCIN 250 MG PO TABS: ORAL_TABLET | ORAL | Status: DC

## 2015-05-08 MED ORDER — FLUVOXAMINE MALEATE 100 MG PO TABS: ORAL_TABLET | ORAL | Status: DC

## 2015-05-08 MED ORDER — LAMOTRIGINE 100 MG PO TABS: 100.0000 mg | ORAL_TABLET | Freq: Every day | ORAL | Status: DC

## 2015-05-08 NOTE — Progress Notes (Signed)
Patient ID: Jennifer Singh, female   DOB: [birth year], 24 y.o.   MRN: [id number]   [facility] [id number] Progress Note  Jennifer Singh [id number] 24 y.o.  05/08/2015  8:54 AM  Chief Complaint:  HPI Comments: Jennifer Singh is  a 24 y/o female with a past psychiatric history significant for Obsessive Compulsive Disorder, ADHD. The patient is referred to psychiatric services for  medication management.    . Location: She report she is tolerating Luvox. Luvox medication  Now 100mg  bid which has helped the obsessions. She says things have not gone worse.  In regard to mood stabilization she was not able to afford Abilify restarted Lamictal she is now taking 50 mg she has felt that it does help the  depression and even obsessions. She feels dose need to increase. No rash reported She is suffering from allergies or SO she is going to make an appointment with a primary care today Job stress and at times she has difficulty focusing. she lost her dad February 2016. He was suffering from carcinoma of the bladder. Family is adjusting but still in grief process  . Quality: The patient reports that her main stressors is work.   . Severity: Depression: 5/10 (0=Very depressed; 5=Neutral; 10=Very Happy)  Anxiety  5/10 (0=no anxiety; 5= moderate/tolerable anxiety; 10= panic attacks)  . Duration-Since she was 6 years  . Timing: depending upon stress level.   . Context; Obsessive thoughts, and compulsions. Fathers death.   . Modifying factors: None  No psychotic or manic symptoms.       Past Medical Family, Social History:  Past Medical History  Diagnosis Date  . ADHD (attention deficit hyperactivity disorder)   . Anxiety   . Obsessive-compulsive disorder   . Oppositional defiant disorder   . Dysmenorrhea   . Central auditory processing disorder (CAPD)    Family History  Problem Relation Age of Onset  . ADD / ADHD Mother   . Depression Maternal Uncle   . Alcohol abuse  Maternal Grandfather   . OCD Maternal Grandmother   . Thyroid disease Brother   . Hypertension Brother   . Hyperlipidemia Brother   . Diabetes Brother   . Hyperlipidemia Father   . Hypertension Father   . Heart attack Neg Hx    Social History   Social History  . Marital Status: Single    Spouse Name: N/A  . Number of Children: N/A  . Years of Education: N/A   Social History Main Topics  . Smoking status: Current Every Day Smoker -- 1.00 packs/day for 2 years    Types: Cigarettes  . Smokeless tobacco: Never Used  . Alcohol Use: 3.0 oz/week    6 Standard drinks or equivalent per week     Comment: 3 alcoholic drinks per week  . Drug Use: No  . Sexual Activity: Yes     Comment: lesbian   Other Topics Concern  . None   Social History Narrative    Outpatient Encounter Prescriptions as of 05/08/2015  Medication Sig  . fluvoxaMINE (LUVOX) 100 MG tablet TAKE 2 a day,  total of (200mg )  . [DISCONTINUED] fluvoxaMINE (LUVOX) 100 MG tablet TAKE 2 a day,  total of (200mg )  . [DISCONTINUED] lamoTRIgine (LAMICTAL) 25 MG tablet Take 1 tablet (25 mg total) by mouth 2 (two) times daily.  Marland Kitchen lamoTRIgine (LAMICTAL) 100 MG tablet Take 1 tablet (100 mg total) by mouth daily.   No facility-administered encounter medications on file as of 05/08/2015.  Review of Systems: Review of Systems  Constitutional: Negative for fever and weight loss.  Cardiovascular: Negative for chest pain.  Gastrointestinal: Negative for nausea.  Skin: Negative for itching and rash.  Neurological: Negative for tremors and headaches.  Psychiatric/Behavioral: Negative for depression, suicidal ideas, hallucinations and substance abuse.     Neurologic: Headache: Negative Seizure: Negative Paresthesias: Negative Physical Exam: Constitutional:  BP 126/64 mmHg  Pulse 95  Ht 5\' 2"  (1.575 m)  Wt 173 lb (78.472 kg)  BMI 31.63 kg/m2  SpO2 95%  General Appearance: alert, oriented, no acute  distress  Musculoskeletal: Gait & Station: normal Patient leans: Right  Psychiatric Specialty Exam:  Psychiatric Specialty Exam:   General Appearance: Casual and Well Groomed  Eye Contact::  Good  Speech:  Clear and Coherent and Normal Rate  Volume:  Normal  Mood:  Less dysphoric  Affect:  Appropriate, Congruent and Full Range  Thought Process:  Coherent, Goal Directed, Linear and Logical  Orientation:  Full (Time, Place, and Person)  Thought Content:  WDL  Suicidal Thoughts:  No  Homicidal Thoughts:  No  Memory:  Immediate;   Good Recent;   Good Remote;   Good  Judgement:  Fair  Insight:  Fair  Psychomotor Activity:  Normal  Concentration:  Fair  Recall:  Good  Akathisia:  No  Handed:  Right  AIMS (if indicated):  Not indicated  Assets:  Communication Skills   language intact  fund of knowledge is average       Assessment: Axis I: Obsessive Compulsive Disorder-. ADHD. Grief   Plan:   Plan of Care:  PLAN:  1. Affirm with the patient that the medications are taken as ordered. Patient  expressed understanding of how their medications were to be used.    Laboratory:   No labs warranted at this time.   Psychotherapy: Therapy: brief supportive therapy provided.  Discussed psychosocial stressors in detail. More than 50% of the visit was spent on individual therapy/counseling.   Medications:  Continue the following psychiatric medications as written prior to this appointment with the following changes::  OCD: continue  Luvox to 100mg  2 tablets. Grief: therapy provided and to work on distraction when depressed. Increase lamictal 100mg  qd.  Risk side effects again explained.  ADHD: will manage depression first . -Risks and benefits, side effects and alternatives discussed with patient, he/she was given an opportunity to ask questions about his/her medication, illness, and treatment. All current psychiatric medications have been reviewed and discussed with the  patient and adjusted as clinically appropriate. The patient has been provided an accurate and updated list of the medications being now prescribed.   Routine PRN Medications:  Negative  Consultations: The patient was encouraged to keep all PCP and specialty clinic appointments.   Safety Concerns:   Patient told to call clinic if any problems occur. Patient advised to go to  ER  if s/he should develop SI/HI, side effects, or if symptoms worsen. Has crisis numbers to call if needed.    Other:   Refer to primary care or urgent care for her flu or cough like symptoms.  8. Patient was instructed to return to clinic in 1 months.  9. The patient was advised to call and cancel their mental health appointment within 24 hours of the appointment, if they are unable to keep the appointment, as well as the three no show and termination from clinic policy. 10. The patient expressed understanding of the plan and agrees with the above.  Time Spent: 25 minutes  Merian Capron, MD 05/08/2015

## 2015-05-11 NOTE — Progress Notes (Signed)
   Subjective:    Patient ID: Jennifer Singh, female    DOB: 10/26/1990, 25 y.o.   MRN: [id number]  HPI Pt is a 25yo female who presents to the clinic with over a week of cough, fever, chest congestion. She has been taking sudafed and her psychiatrist discussed that was not permitted taking lamictal. She does feel SOB and at times has heard wheezing. Cough is productive.   .. Active Ambulatory Problems    Diagnosis Date Noted  . OCD (obsessive compulsive disorder) 06/08/2011  . Left knee pain 05/08/2013  . Weight gain 12/25/2013   Resolved Ambulatory Problems    Diagnosis Date Noted  . ADHD (attention deficit hyperactivity disorder) 06/08/2011  . ODD (oppositional defiant disorder) 06/08/2011   Past Medical History  Diagnosis Date  . Anxiety   . Obsessive-compulsive disorder   . Oppositional defiant disorder   . Dysmenorrhea   . Central auditory processing disorder (CAPD)    .Marland Kitchen Family History  Problem Relation Age of Onset  . ADD / ADHD Mother   . Depression Maternal Uncle   . Alcohol abuse Maternal Grandfather   . OCD Maternal Grandmother   . Thyroid disease Brother   . Hypertension Brother   . Hyperlipidemia Brother   . Diabetes Brother   . Hyperlipidemia Father   . Hypertension Father   . Heart attack Neg Hx    .Marland Kitchen Social History   Social History  . Marital Status: Single    Spouse Name: N/A  . Number of Children: N/A  . Years of Education: N/A   Occupational History  . Not on file.   Social History Main Topics  . Smoking status: Current Every Day Smoker -- 1.00 packs/day for 2 years    Types: Cigarettes  . Smokeless tobacco: Never Used  . Alcohol Use: 3.0 oz/week    6 Standard drinks or equivalent per week     Comment: 3 alcoholic drinks per week  . Drug Use: No  . Sexual Activity: Yes     Comment: lesbian   Other Topics Concern  . Not on file   Social History Narrative      Review of Systems  All other systems reviewed and are  negative.      Objective:   Physical Exam  Constitutional: She is oriented to person, place, and time. She appears well-developed and well-nourished.  HENT:  Head: Normocephalic and atraumatic.  Right Ear: External ear normal.  Left Ear: External ear normal.  Nose: Nose normal.  Mouth/Throat: Oropharynx is clear and moist. No oropharyngeal exudate.  TM's clear.  NO sinus tenderness to palpation.   Eyes: Conjunctivae are normal. Right eye exhibits no discharge. Left eye exhibits no discharge.  Neck: Normal range of motion. Neck supple.  Cardiovascular: Regular rhythm and normal heart sounds.   Tachycardia at 101.   Pulmonary/Chest: Effort normal.  Expiratory wheezing bilateral lungs.  No rhonchi.   Lymphadenopathy:    She has no cervical adenopathy.  Neurological: She is alert and oriented to person, place, and time.  Psychiatric: She has a normal mood and affect. Her behavior is normal.          Assessment & Plan:  Acute bronchitis/reactive airway disease- treated with zpak and prednisone. Other symptomatic care discussed. Delsym for cough. Follow up if not improving.    Encouraged pt to schedule CPe.

## 2015-05-14 ENCOUNTER — Telehealth: Payer: Self-pay | Admitting: *Deleted

## 2015-05-14 ENCOUNTER — Other Ambulatory Visit: Payer: Self-pay | Admitting: *Deleted

## 2015-05-14 MED ORDER — LEVOFLOXACIN 500 MG PO TABS: 500.0000 mg | ORAL_TABLET | Freq: Every day | ORAL | Status: AC

## 2015-05-14 NOTE — Telephone Encounter (Signed)
Called pt and said the new antibiotic was sent to her pharmacy.  If she isn't better after this she needs to callback for a f/u appt.

## 2015-05-14 NOTE — Telephone Encounter (Signed)
Pt called and said she has finished her zpac and prednisone and felt a little better until yesterday.  She still has a lot of congestion in her chest and states its hard to breathe when she is lying down.  Tami Ribas mentioned they could try another medicine if not any better. What do you recommend or should she come in?

## 2015-05-14 NOTE — Telephone Encounter (Signed)
Center for upper section for any antibiotic, Levaquin for her to try. If she is not better after this round of antibiotics and she will need to come back in a make an appointment. She may also want to try a product such as Mucinex to help try to remove some the congestion out.

## 2015-05-19 ENCOUNTER — Encounter: Payer: Self-pay | Admitting: Physician Assistant

## 2015-05-19 ENCOUNTER — Other Ambulatory Visit: Payer: Self-pay | Admitting: Physician Assistant

## 2015-05-19 ENCOUNTER — Ambulatory Visit (INDEPENDENT_AMBULATORY_CARE_PROVIDER_SITE_OTHER): Payer: BC Managed Care – PPO | Admitting: Physician Assistant

## 2015-05-19 VITALS — BP 121/75 | HR 106 | Ht 62.0 in | Wt 172.0 lb

## 2015-05-19 DIAGNOSIS — J4521 Mild intermittent asthma with (acute) exacerbation: Secondary | ICD-10-CM

## 2015-05-19 DIAGNOSIS — J209 Acute bronchitis, unspecified: Secondary | ICD-10-CM | POA: Diagnosis not present

## 2015-05-19 MED ORDER — HYDROCODONE-HOMATROPINE 5-1.5 MG/5ML PO SYRP: 5.0000 mL | ORAL_SOLUTION | Freq: Three times a day (TID) | ORAL | Status: DC | PRN

## 2015-05-19 MED ORDER — ALBUTEROL SULFATE HFA 108 (90 BASE) MCG/ACT IN AERS: 2.0000 | INHALATION_SPRAY | Freq: Four times a day (QID) | RESPIRATORY_TRACT | Status: DC | PRN

## 2015-05-19 MED ORDER — BECLOMETHASONE DIPROPIONATE 80 MCG/ACT IN AERS: 1.0000 | INHALATION_SPRAY | Freq: Two times a day (BID) | RESPIRATORY_TRACT | Status: DC

## 2015-05-19 NOTE — Progress Notes (Signed)
   Subjective:    Patient ID: Jennifer Singh, female    DOB: 08-11-1990, 25 y.o.   MRN: [id number]  HPI Pt presents to the clinic with ongoing persistent cough. Pt was seen for acute bronchitis at end of december and given zpak and prednisone. Got some better but then felt worse again. Was sent levaquin from office 4 days ago. She feels much better but still has a cough. Cough is dry. No fever, chills, body aches. No nausea or vomiting. Cough worse at night.    Review of Systems  All other systems reviewed and are negative.      Objective:   Physical Exam  Constitutional: She is oriented to person, place, and time. She appears well-developed and well-nourished.  HENT:  Head: Normocephalic and atraumatic.  Right Ear: External ear normal.  Left Ear: External ear normal.  Nose: Nose normal.  Mouth/Throat: Oropharynx is clear and moist. No oropharyngeal exudate.  TM's clear.   Neck: Normal range of motion. Neck supple.  Cardiovascular: Normal rate, regular rhythm and normal heart sounds.   Pulmonary/Chest: Effort normal and breath sounds normal. She has no wheezes.  Dry cough.   Lymphadenopathy:    She has no cervical adenopathy.  Neurological: She is alert and oriented to person, place, and time.  Skin:  Flushed cheeks.   Psychiatric: She has a normal mood and affect. Her behavior is normal.          Assessment & Plan:  Acute bronchitis/reactive airway disease- finish the levaquin. Start qvar 1 puff bid. Albuterol inhaler for rescue as needed. Hycodan cough syrup for bedtime. Follow up in next month. Discussed post-infectious cough can last for 6 weeks.

## 2015-05-20 MED ORDER — HYDROCODONE-HOMATROPINE 5-1.5 MG/5ML PO SYRP: 5.0000 mL | ORAL_SOLUTION | Freq: Three times a day (TID) | ORAL | Status: DC | PRN

## 2015-05-20 NOTE — Addendum Note (Signed)
Addended by: Donella Stade on: 05/20/2015 04:07 PM   Modules accepted: Orders

## 2015-06-08 ENCOUNTER — Ambulatory Visit (HOSPITAL_COMMUNITY): Payer: BC Managed Care – PPO | Admitting: Psychiatry

## 2015-06-08 ENCOUNTER — Telehealth: Payer: Self-pay | Admitting: *Deleted

## 2015-06-08 ENCOUNTER — Encounter: Payer: Self-pay | Admitting: Physician Assistant

## 2015-06-08 NOTE — Telephone Encounter (Signed)
Called pt and lvm informing her that appt has been cancelled.Jennifer Singh, [name]

## 2015-06-17 ENCOUNTER — Telehealth (HOSPITAL_COMMUNITY): Payer: Self-pay | Admitting: *Deleted

## 2015-06-17 NOTE — Telephone Encounter (Signed)
Received medication request from Jennifer Singh for Lamictal 100mg . Per Dr. De Nurse, pt will to schedule an appt with clinic before refill could be issued. LVM for pt to return call to office to schedule appt for medication refill.

## 2015-06-23 ENCOUNTER — Telehealth (HOSPITAL_COMMUNITY): Payer: Self-pay | Admitting: *Deleted

## 2015-06-23 DIAGNOSIS — F429 Obsessive-compulsive disorder, unspecified: Secondary | ICD-10-CM

## 2015-06-23 MED ORDER — FLUVOXAMINE MALEATE 100 MG PO TABS: ORAL_TABLET | ORAL | Status: DC

## 2015-06-23 MED ORDER — LAMOTRIGINE 100 MG PO TABS: 100.0000 mg | ORAL_TABLET | Freq: Every day | ORAL | Status: DC

## 2015-06-23 NOTE — Telephone Encounter (Signed)
Received medication request from [facility] for Fluvoxamine Maleate 100mg , #60 and Lamotrigine 100mg , #30. Per Dr. De Nurse, medication refills are authorized and prescriptions were sent to pharmacy. Pt is schedule for a f/u appt on 06/30/15. Called and informed pt of prescription status. Pt verbalizes understanding.

## 2015-06-30 ENCOUNTER — Encounter (HOSPITAL_COMMUNITY): Payer: Self-pay | Admitting: Psychiatry

## 2015-06-30 ENCOUNTER — Ambulatory Visit (INDEPENDENT_AMBULATORY_CARE_PROVIDER_SITE_OTHER): Payer: BC Managed Care – PPO | Admitting: Psychiatry

## 2015-06-30 VITALS — BP 116/64 | HR 78 | Ht 62.0 in | Wt 168.0 lb

## 2015-06-30 DIAGNOSIS — F4321 Adjustment disorder with depressed mood: Secondary | ICD-10-CM | POA: Diagnosis not present

## 2015-06-30 DIAGNOSIS — F430 Acute stress reaction: Secondary | ICD-10-CM

## 2015-06-30 DIAGNOSIS — F902 Attention-deficit hyperactivity disorder, combined type: Secondary | ICD-10-CM

## 2015-06-30 DIAGNOSIS — F410 Panic disorder [episodic paroxysmal anxiety] without agoraphobia: Secondary | ICD-10-CM

## 2015-06-30 DIAGNOSIS — F429 Obsessive-compulsive disorder, unspecified: Secondary | ICD-10-CM | POA: Diagnosis not present

## 2015-06-30 MED ORDER — HYDROXYZINE PAMOATE 25 MG PO CAPS: 25.0000 mg | ORAL_CAPSULE | Freq: Every day | ORAL | Status: DC | PRN

## 2015-06-30 MED ORDER — LAMOTRIGINE 150 MG PO TABS: 150.0000 mg | ORAL_TABLET | Freq: Every day | ORAL | Status: DC

## 2015-06-30 MED ORDER — FLUVOXAMINE MALEATE 100 MG PO TABS: ORAL_TABLET | ORAL | Status: DC

## 2015-06-30 NOTE — Progress Notes (Signed)
Patient ID: Jennifer Singh, female   DOB: 11-20-1990, 25 y.o.   MRN: [id number]   [facility] [id number] Progress Note  Jennifer Singh [id number] 24 y.o.  05/08/2015  4:26 PM  Chief Complaint:  HPI Comments: Jennifer Singh is  a 25 y/o female with a past psychiatric history significant for Obsessive Compulsive Disorder, ADHD. The patient returns for psychiiatric services for  medication management.    . Location: She report she is tolerating Luvox. Luvox medication  Now 100mg  bid which has helped the obsessions. She says things have not gone worse.  This is a early appointment. Says that she was having chest pain couple of days ago. She went to the urgent care and stated stated that she go to  hospital   on her own. Says that she is not having the chest pain today but she wanted to make an appointment because she is not sure if it is related or not related anxiety. I encourage her to make an appointment upstairs or walk to the urgent home stressors she has a primary care physician and his building she'll make an appointment with them also. Patient is denying having any chest pain or palpitations today but she says that it may be related. The anniversary of her father's that in February. She does endorse some racing thoughts and anxiety including excessive worries as of now she lost her dad February 2016. He was suffering from carcinoma of the bladder. Family is adjusting but still in grief process  Aggravating factors: work stress. Fathers anniversary of death   . Severity: Depression: 5/10 (0=Very depressed; 5=Neutral; 10=Very Happy)  Anxiety  6/10 (0=no anxiety; 5= moderate/tolerable anxiety; 10= panic attacks)  . Duration-Since she was 6 years  . Timing: depending upon stress level.   . Context; Obsessive thoughts, and compulsions. Fathers death.   . Modifying factors: None  No psychotic or manic symptoms.     Past Medical Family, Social History:  Past Medical History   Diagnosis Date  . ADHD (attention deficit hyperactivity disorder)   . Anxiety   . Obsessive-compulsive disorder   . Oppositional defiant disorder   . Dysmenorrhea   . Central auditory processing disorder (CAPD)    Family History  Problem Relation Age of Onset  . ADD / ADHD Mother   . Depression Maternal Uncle   . Alcohol abuse Maternal Grandfather   . OCD Maternal Grandmother   . Thyroid disease Brother   . Hypertension Brother   . Hyperlipidemia Brother   . Diabetes Brother   . Hyperlipidemia Father   . Hypertension Father   . Heart attack Neg Hx    Social History   Social History  . Marital Status: Single    Spouse Name: N/A  . Number of Children: N/A  . Years of Education: N/A   Social History Main Topics  . Smoking status: Current Every Day Smoker -- 1.00 packs/day for 2 years    Types: Cigarettes  . Smokeless tobacco: Never Used  . Alcohol Use: 3.0 oz/week    6 Standard drinks or equivalent per week     Comment: 3 alcoholic drinks per week  . Drug Use: No  . Sexual Activity: Yes     Comment: lesbian   Other Topics Concern  . None   Social History Narrative    Outpatient Encounter Prescriptions as of 06/30/2015  Medication Sig  . acetaminophen (TYLENOL) 325 MG tablet Take by mouth.  . beclomethasone (QVAR) 80 MCG/ACT inhaler  Inhale 1 puff into the lungs 2 (two) times daily.  . fluvoxaMINE (LUVOX) 100 MG tablet TAKE 2 a day,  total of (200mg )  . HYDROcodone-homatropine (HYCODAN) 5-1.5 MG/5ML syrup Take 5 mLs by mouth every 8 (eight) hours as needed for cough.  . lamoTRIgine (LAMICTAL) 150 MG tablet Take 1 tablet (150 mg total) by mouth daily.  . [DISCONTINUED] fluvoxaMINE (LUVOX) 100 MG tablet TAKE 2 a day,  total of (200mg )  . [DISCONTINUED] lamoTRIgine (LAMICTAL) 100 MG tablet Take 1 tablet (100 mg total) by mouth daily.  Marland Kitchen albuterol (PROVENTIL HFA;VENTOLIN HFA) 108 (90 Base) MCG/ACT inhaler Inhale 2 puffs into the lungs every 6 (six) hours as needed for  wheezing or shortness of breath. (Patient not taking: Reported on 05/19/2015)  . hydrOXYzine (VISTARIL) 25 MG capsule Take 1 capsule (25 mg total) by mouth daily as needed for itching.   No facility-administered encounter medications on file as of 06/30/2015.      Review of Systems: Review of Systems  Constitutional: Negative for fever and weight loss.  Cardiovascular: Negative for chest pain and palpitations.  Gastrointestinal: Negative for nausea.  Skin: Negative for itching and rash.  Neurological: Negative for tremors and headaches.  Psychiatric/Behavioral: Negative for suicidal ideas, hallucinations and substance abuse. The patient is nervous/anxious.      Neurologic: Headache: Negative Seizure: Negative Paresthesias: Negative Physical Exam: Constitutional:  BP 116/64 mmHg  Pulse 78  Ht 5\' 2"  (1.575 m)  Wt 168 lb (76.204 kg)  BMI 30.72 kg/m2  SpO2 98%  General Appearance: alert, oriented, no acute distress  Musculoskeletal: Gait & Station: normal Patient leans: Right  Psychiatric Specialty Exam:  Psychiatric Specialty Exam:   General Appearance: Casual and Well Groomed  Eye Contact::  Good  Speech:  Clear and Coherent and Normal Rate  Volume:  Normal  Mood:  Less dysphoric  Affect:  Appropriate, Congruent and Full Range  Thought Process:  Coherent, Goal Directed, Linear and Logical  Orientation:  Full (Time, Place, and Person)  Thought Content:  WDL  Suicidal Thoughts:  No  Homicidal Thoughts:  No  Memory:  Immediate;   Good Recent;   Good Remote;   Good  Judgement:  Fair  Insight:  Fair  Psychomotor Activity:  Normal  Concentration:  Fair  Recall:  Good  Akathisia:  No  Handed:  Right  AIMS (if indicated):  Not indicated  Assets:  Communication Skills   language intact  fund of knowledge is average       Assessment: Axis I: Obsessive Compulsive Disorder-. ADHD. Grief. Anxiety or panic due to stress   Plan:   Plan of Care:  PLAN:  1.  Affirm with the patient that the medications are taken as ordered. Patient  expressed understanding of how their medications were to be used.    Laboratory:   No labs warranted at this time.   Psychotherapy: Therapy: brief supportive therapy provided.  Discussed psychosocial stressors in detail. More than 50% of the visit was spent on individual therapy/counseling.   Medications:  Continue the following psychiatric medications as written prior to this appointment with the following changes::  OCD: continue  Luvox to 100mg  2 tablets. Grief: therapy provided and to work on distraction when depressed. Increase lamictal 150mg  qd for mood symptoms Add vistaril 25mg  bid prn for anxiety. She opted out from klonopine today She is advised to make appointment upstairs with her primary care and [facility] if needed or chest pain re occurs. For now  she is not endorsing it.  Risk side effects again explained.  ADHD: baseline -Risks and benefits, side effects and alternatives discussed with patient, he/she was given an opportunity to ask questions about his/her medication, illness, and treatment. All current psychiatric medications have been reviewed and discussed with the patient and adjusted as clinically appropriate. The patient has been provided an accurate and updated list of the medications being now prescribed.   Routine PRN Medications:  Negative  Consultations: The patient was encouraged to keep all PCP and specialty clinic appointments.   Safety Concerns:   Patient told to call clinic if any problems occur. Patient advised to go to  ER  if s/he should develop SI/HI, side effects, or if symptoms worsen. Has crisis numbers to call if needed.    Other:   Refer to primary care or urgent care for her flu or cough like symptoms.  8. Patient was instructed to return to clinic in 3 weeks.  9. The patient was advised to call and cancel their mental health appointment within 24 hours of the  appointment, if they are unable to keep the appointment, as well as the three no show and termination from clinic policy. 10. The patient expressed understanding of the plan and agrees with the above.   Time Spent: 25 minutes  Merian Capron, MD 06/30/2015

## 2015-07-13 ENCOUNTER — Telehealth: Payer: Self-pay

## 2015-07-13 NOTE — Telephone Encounter (Signed)
Pt informed and reassured.Jennifer Singh

## 2015-07-13 NOTE — Telephone Encounter (Signed)
Please let patient know that we only treat if becomes symptomatically. Monitor for diarrhea and abdominal cramping. She may or may not run a fever. Okay to return to  work. Just use good hygiene.

## 2015-07-13 NOTE — Telephone Encounter (Signed)
Jennifer Singh is concerned because her aunt was just diagnosed with C - Diff after she had spent 2 days with her aunt in close contact. She states she even bathed her. She wants to know if she should be concerned about getting C-Diff and if she should be treated and if she should go to work?

## 2015-07-21 ENCOUNTER — Ambulatory Visit (HOSPITAL_COMMUNITY): Payer: Self-pay | Admitting: Psychiatry

## 2015-07-22 ENCOUNTER — Ambulatory Visit (HOSPITAL_COMMUNITY): Payer: Self-pay | Admitting: Psychiatry

## 2015-07-24 ENCOUNTER — Encounter (HOSPITAL_COMMUNITY): Payer: Self-pay | Admitting: Psychiatry

## 2015-07-24 ENCOUNTER — Ambulatory Visit (INDEPENDENT_AMBULATORY_CARE_PROVIDER_SITE_OTHER): Payer: BC Managed Care – PPO | Admitting: Psychiatry

## 2015-07-24 VITALS — BP 124/68 | HR 85 | Ht 62.0 in | Wt 168.0 lb

## 2015-07-24 DIAGNOSIS — F902 Attention-deficit hyperactivity disorder, combined type: Secondary | ICD-10-CM | POA: Diagnosis not present

## 2015-07-24 DIAGNOSIS — F429 Obsessive-compulsive disorder, unspecified: Secondary | ICD-10-CM

## 2015-07-24 DIAGNOSIS — F4321 Adjustment disorder with depressed mood: Secondary | ICD-10-CM

## 2015-07-24 DIAGNOSIS — F410 Panic disorder [episodic paroxysmal anxiety] without agoraphobia: Secondary | ICD-10-CM | POA: Diagnosis not present

## 2015-07-24 DIAGNOSIS — F430 Acute stress reaction: Secondary | ICD-10-CM

## 2015-07-24 MED ORDER — FLUVOXAMINE MALEATE 100 MG PO TABS: ORAL_TABLET | ORAL | Status: DC

## 2015-07-24 MED ORDER — GABAPENTIN 100 MG PO CAPS: 100.0000 mg | ORAL_CAPSULE | Freq: Three times a day (TID) | ORAL | Status: DC

## 2015-07-24 NOTE — Progress Notes (Signed)
Patient ID: Jennifer Singh, female   DOB: 1990-11-16, 25 y.o.   MRN: [id number]   [facility] [address] Progress Note  Raelee Alfred [id number] 25 y.o.  05/08/2015  10:43 AM  Chief Complaint:  HPI Comments: Jennifer Singh is  a 25 y/o female with a past psychiatric history significant for Obsessive Compulsive Disorder, ADHD. The patient returns for psychiiatric services for  medication management.    Last visit she was having panic attacks attacks and anxiety related to her dad's anniversary. We started on Vistaril and I recommend she follow up with a primary care physician also Her panic attacks are somewhat in control but she feels tired when she takes the Vistaril it does help her sleep at night. She feels feels distracted it is not affecting her work significantly but she does get easily forgetful and feels that her ADHD medication should be considered. She also endorses mood swings getting agitated easily. Her OCD symptoms are more in control she is not checking on her doors and car locks again and again Aggravating factors: work stress. Fathers anniversary of death   . Severity: Depression: 5/10 (0=Very depressed; 5=Neutral; 10=Very Happy)  Anxiety  6/10 (0=no anxiety; 5= moderate/tolerable anxiety; 10= panic attacks)  . Duration-Since she was 6 years  . Timing: depending upon stress level.   . Context; Obsessive thoughts, and compulsions. Fathers death.   . Modifying factors: None  No psychotic or manic symptoms.     Past Medical Family, Social History:  Past Medical History  Diagnosis Date  . ADHD (attention deficit hyperactivity disorder)   . Anxiety   . Obsessive-compulsive disorder   . Oppositional defiant disorder   . Dysmenorrhea   . Central auditory processing disorder (CAPD)    Family History  Problem Relation Age of Onset  . ADD / ADHD Mother   . Depression Maternal Uncle   . Alcohol abuse Maternal Grandfather   . OCD Maternal Grandmother   .  Thyroid disease Brother   . Hypertension Brother   . Hyperlipidemia Brother   . Diabetes Brother   . Hyperlipidemia Father   . Hypertension Father   . Heart attack Neg Hx    Social History   Social History  . Marital Status: Single    Spouse Name: N/A  . Number of Children: N/A  . Years of Education: N/A   Social History Main Topics  . Smoking status: Current Every Day Smoker -- 1.00 packs/day for 2 years    Types: Cigarettes  . Smokeless tobacco: Never Used  . Alcohol Use: 3.0 oz/week    6 Standard drinks or equivalent per week     Comment: 3 alcoholic drinks per week  . Drug Use: No  . Sexual Activity: Yes     Comment: lesbian   Other Topics Concern  . None   Social History Narrative    Outpatient Encounter Prescriptions as of 07/24/2015  Medication Sig  . acetaminophen (TYLENOL) 325 MG tablet Take by mouth.  . beclomethasone (QVAR) 80 MCG/ACT inhaler Inhale 1 puff into the lungs 2 (two) times daily.  . fluvoxaMINE (LUVOX) 100 MG tablet TAKE 2 a day,  total of (200mg )  . HYDROcodone-homatropine (HYCODAN) 5-1.5 MG/5ML syrup Take 5 mLs by mouth every 8 (eight) hours as needed for cough.  . lamoTRIgine (LAMICTAL) 150 MG tablet Take 1 tablet (150 mg total) by mouth daily.  . [DISCONTINUED] fluvoxaMINE (LUVOX) 100 MG tablet TAKE 2 a day,  total of (200mg )  . [  DISCONTINUED] hydrOXYzine (VISTARIL) 25 MG capsule Take 1 capsule (25 mg total) by mouth daily as needed for itching.  Marland Kitchen albuterol (PROVENTIL HFA;VENTOLIN HFA) 108 (90 Base) MCG/ACT inhaler Inhale 2 puffs into the lungs every 6 (six) hours as needed for wheezing or shortness of breath. (Patient not taking: Reported on 05/19/2015)  . gabapentin (NEURONTIN) 100 MG capsule Take 1 capsule (100 mg total) by mouth 3 (three) times daily.   No facility-administered encounter medications on file as of 07/24/2015.      Review of Systems: Review of Systems  Constitutional: Negative for fever and weight loss.  Cardiovascular:  Negative for chest pain and palpitations.  Gastrointestinal: Negative for nausea.  Skin: Negative for itching and rash.  Neurological: Negative for tingling and headaches.  Psychiatric/Behavioral: Negative for suicidal ideas, hallucinations and substance abuse. The patient is nervous/anxious.      Neurologic: Headache: Negative Seizure: Negative Paresthesias: Negative Physical Exam: Constitutional:  BP 124/68 mmHg  Pulse 85  Ht 5\' 2"  (1.575 m)  Wt 168 lb (76.204 kg)  BMI 30.72 kg/m2  SpO2 99%  General Appearance: alert, oriented, no acute distress  Musculoskeletal: Gait & Station: normal Patient leans: Right  Psychiatric Specialty Exam:  Psychiatric Specialty Exam:   General Appearance: Casual and Well Groomed  Eye Contact::  Good  Speech:  Clear and Coherent and Normal Rate  Volume:  Normal  Mood: not hopeless but feels anxious  Affect:  Appropriate, Congruent and Full Range  Thought Process:  Coherent, Goal Directed, Linear and Logical  Orientation:  Full (Time, Place, and Person)  Thought Content:  WDL  Suicidal Thoughts:  No  Homicidal Thoughts:  No  Memory:  Immediate;   Good Recent;   Good Remote;   Good  Judgement:  Fair  Insight:  Fair  Psychomotor Activity:  Normal  Concentration:  Fair  Recall:  Good  Akathisia:  No  Handed:  Right  AIMS (if indicated):  Not indicated  Assets:  Communication Skills   language intact  fund of knowledge is average       Assessment: Axis I: Obsessive Compulsive Disorder-. ADHD. Grief. Anxiety or panic due to stress   Plan:   Plan of Care:  PLAN:  1. Affirm with the patient that the medications are taken as ordered. Patient  expressed understanding of how their medications were to be used.    Laboratory:   No labs warranted at this time.   Psychotherapy: Therapy: brief supportive therapy provided.  Discussed psychosocial stressors in detail. More than 50% of the visit was spent on individual  therapy/counseling.   Medications:  Continue the following psychiatric medications as written prior to this appointment with the following changes::  OCD: continue  Luvox to 100mg  2 tablets. Grief: better GAD ; anxiety fluctuates with stress but less panic attacks.  Will add neurontin 100mg  upto tid for anxiety and mood symptoms Continue lamictacl. No rash reported Take vistaril at night only and not during the day. After one week stop vistaril.  ADHD: may consider med once anxiety is controlled.  -Risks and benefits, side effects and alternatives discussed with patient, he/she was given an opportunity to ask questions about his/her medication, illness, and treatment. All current psychiatric medications have been reviewed and discussed with the patient and adjusted as clinically appropriate. The patient has been provided an accurate and updated list of the medications being now prescribed.   Routine PRN Medications:  Negative  Consultations: The patient was encouraged to keep all PCP  and specialty clinic appointments.       Time Spent: 25 minutes  Merian Capron, MD 07/24/2015

## 2015-09-11 ENCOUNTER — Telehealth (HOSPITAL_COMMUNITY): Payer: Self-pay | Admitting: *Deleted

## 2015-09-11 MED ORDER — LAMOTRIGINE 150 MG PO TABS: 150.0000 mg | ORAL_TABLET | Freq: Every day | ORAL | Status: DC

## 2015-09-11 NOTE — Telephone Encounter (Signed)
Pt called for a refill for Lamictal 150 mg. Per Dr. De Nurse, medication request for Lamictal 150mg , #30 is authorized for refill. Rx was sent to [facility]. Pt is schedule for a f/u appt on 09/18/15. Called and informed pt of rx status. Pt verbalizes understanding.

## 2015-09-18 ENCOUNTER — Ambulatory Visit (HOSPITAL_COMMUNITY): Payer: Self-pay | Admitting: Psychiatry

## 2015-10-06 ENCOUNTER — Other Ambulatory Visit (HOSPITAL_COMMUNITY): Payer: Self-pay | Admitting: Psychiatry

## 2015-10-06 NOTE — Telephone Encounter (Signed)
Received medication request from Jennifer Singh for Lamictal 150mg . Per Dr. De Nurse, medication is denied. Pt will need to schedule an appt with clinic.

## 2015-10-13 ENCOUNTER — Other Ambulatory Visit (HOSPITAL_COMMUNITY): Payer: Self-pay | Admitting: Psychiatry

## 2015-10-14 NOTE — Telephone Encounter (Signed)
Received medication request from [place] for Lamictal. Per Dr. De Nurse, medication request for Lamictal 150mg , #30. Rx was sent to pharmacy. Called and informed pt of rx status. Pt is schedule for a f/u appt on 10/20/15. Informed pt, no more refills will be issued until pt is seen, per Dr. De Nurse. Per verbalizes understanding.

## 2015-10-15 ENCOUNTER — Ambulatory Visit (HOSPITAL_COMMUNITY): Payer: Self-pay | Admitting: Psychiatry

## 2015-10-20 ENCOUNTER — Ambulatory Visit (HOSPITAL_COMMUNITY): Payer: Self-pay | Admitting: Psychiatry

## 2015-10-27 ENCOUNTER — Encounter (HOSPITAL_COMMUNITY): Payer: Self-pay | Admitting: Psychiatry

## 2015-10-27 ENCOUNTER — Ambulatory Visit (INDEPENDENT_AMBULATORY_CARE_PROVIDER_SITE_OTHER): Payer: BC Managed Care – PPO | Admitting: Psychiatry

## 2015-10-27 VITALS — BP 118/70 | HR 95 | Ht 62.0 in | Wt 165.0 lb

## 2015-10-27 DIAGNOSIS — F902 Attention-deficit hyperactivity disorder, combined type: Secondary | ICD-10-CM

## 2015-10-27 DIAGNOSIS — F410 Panic disorder [episodic paroxysmal anxiety] without agoraphobia: Secondary | ICD-10-CM

## 2015-10-27 DIAGNOSIS — F430 Acute stress reaction: Secondary | ICD-10-CM

## 2015-10-27 DIAGNOSIS — F429 Obsessive-compulsive disorder, unspecified: Secondary | ICD-10-CM | POA: Diagnosis not present

## 2015-10-27 MED ORDER — LAMOTRIGINE 150 MG PO TABS: 150.0000 mg | ORAL_TABLET | Freq: Every day | ORAL | Status: DC

## 2015-10-27 MED ORDER — AMPHETAMINE-DEXTROAMPHET ER 5 MG PO CP24: 5.0000 mg | ORAL_CAPSULE | Freq: Every day | ORAL | Status: DC

## 2015-10-27 MED ORDER — GABAPENTIN 100 MG PO CAPS: 200.0000 mg | ORAL_CAPSULE | Freq: Every day | ORAL | Status: DC

## 2015-10-27 MED ORDER — FLUVOXAMINE MALEATE 100 MG PO TABS: ORAL_TABLET | ORAL | Status: DC

## 2015-10-27 NOTE — Progress Notes (Signed)
Patient ID: Jennifer Singh, female   DOB: 1991/04/28, 25 y.o.   MRN: [id number]   [facility] [address] Progress Note  Willette Lundsten [id number] 24 y.o.  05/08/2015  11:32 AM  Chief Complaint:  HPI Comments: [name] is  a 25 y/o female with a past psychiatric history significant for Obsessive Compulsive Disorder, ADHD. The patient returns for psychiiatric services and  medication management.    Last visit we added Neurontin for her anxiety attacks she is feeling tired on Vistaril. She feels sedated related endorses excessive sleepiness and inattention she had quit her job at [employer] now [employer] at [employer]. Has difficulty multi tasking Endorses anxiety, worriful, difficult to focus.  Denies alcohol use recently Obsessions are better since /fluvoxamine was increased. Not checking on doors.  Aggravating factors: work stress. Fathers anniversary of death   . Severity: Depression: 5/10 (0=Very depressed; 5=Neutral; 10=Very Happy)  Anxiety  6/10 (0=no anxiety; 5= moderate/tolerable anxiety; 10= panic attacks)  . Duration-Since she was 6 years  . Timing: depending upon stress level.   . Context; Obsessive thoughts, and compulsions. Fathers death.   . Modifying factors: meds . Family support  No psychotic or manic symptoms.     Past Medical Family, Social History:  Past Medical History  Diagnosis Date  . ADHD (attention deficit hyperactivity disorder)   . Anxiety   . Obsessive-compulsive disorder   . Oppositional defiant disorder   . Dysmenorrhea   . Central auditory processing disorder (CAPD)    Family History  Problem Relation Age of Onset  . ADD / ADHD Mother   . Depression Maternal Uncle   . Alcohol abuse Maternal Grandfather   . OCD Maternal Grandmother   . Thyroid disease Brother   . Hypertension Brother   . Hyperlipidemia Brother   . Diabetes Brother   . Hyperlipidemia Father   . Hypertension Father   . Heart attack Neg Hx     Social History   Social History  . Marital Status: Single    Spouse Name: N/A  . Number of Children: N/A  . Years of Education: N/A   Social History Main Topics  . Smoking status: Current Every Day Smoker -- 1.00 packs/day for 2 years    Types: Cigarettes  . Smokeless tobacco: Never Used  . Alcohol Use: No     Comment: 0  . Drug Use: No  . Sexual Activity: Yes     Comment: lesbian   Other Topics Concern  . None   Social History Narrative    Outpatient Encounter Prescriptions as of 10/27/2015  Medication Sig  . albuterol (PROVENTIL HFA;VENTOLIN HFA) 108 (90 Base) MCG/ACT inhaler Inhale 2 puffs into the lungs every 6 (six) hours as needed for wheezing or shortness of breath.  . beclomethasone (QVAR) 80 MCG/ACT inhaler Inhale 1 puff into the lungs 2 (two) times daily.  . fluvoxaMINE (LUVOX) 100 MG tablet TAKE 2 a day,  total of (200mg )  . HYDROcodone-homatropine (HYCODAN) 5-1.5 MG/5ML syrup Take 5 mLs by mouth every 8 (eight) hours as needed for cough.  . lamoTRIgine (LAMICTAL) 150 MG tablet Take 1 tablet (150 mg total) by mouth daily.  . [DISCONTINUED] fluvoxaMINE (LUVOX) 100 MG tablet TAKE 2 a day,  total of (200mg )  . [DISCONTINUED] lamoTRIgine (LAMICTAL) 150 MG tablet TAKE ONE TABLET BY MOUTH DAILY  . acetaminophen (TYLENOL) 325 MG tablet Take by mouth. Reported on 10/27/2015  . amphetamine-dextroamphetamine (ADDERALL XR) 5 MG 24 hr capsule Take  1 capsule (5 mg total) by mouth daily.  Marland Kitchen gabapentin (NEURONTIN) 100 MG capsule Take 2 capsules (200 mg total) by mouth at bedtime.  . [DISCONTINUED] gabapentin (NEURONTIN) 100 MG capsule Take 1 capsule (100 mg total) by mouth 3 (three) times daily. (Patient not taking: Reported on 10/27/2015)   No facility-administered encounter medications on file as of 10/27/2015.      Review of Systems: Review of Systems  Constitutional: Positive for malaise/fatigue. Negative for fever and weight loss.  Cardiovascular: Negative for chest  pain and palpitations.  Gastrointestinal: Negative for nausea.  Skin: Negative for itching and rash.  Neurological: Negative for tingling and headaches.  Psychiatric/Behavioral: Negative for suicidal ideas, hallucinations and substance abuse. The patient is nervous/anxious.      Neurologic: Headache: Negative Seizure: Negative Paresthesias: Negative Physical Exam: Constitutional:  BP 118/70 mmHg  Pulse 95  Ht 5\' 2"  (1.575 m)  Wt 165 lb (74.844 kg)  BMI 30.17 kg/m2  SpO2 99%  General Appearance: alert, oriented, no acute distress  Musculoskeletal: Gait & Station: normal Patient leans: Right  Psychiatric Specialty Exam:  Psychiatric Specialty Exam:   General Appearance: Casual and Well Groomed  Eye Contact::  Good  Speech:  Clear and Coherent and Normal Rate  Volume:  Normal  Mood: somewhat dysphoric and anxious  Affect:  Appropriate, Congruent and Full Range  Thought Process:  Coherent, Goal Directed, Linear and Logical  Orientation:  Full (Time, Place, and Person)  Thought Content:  WDL  Suicidal Thoughts:  No  Homicidal Thoughts:  No  Memory:  Immediate;   Good Recent;   Good Remote;   Good  Judgement:  Fair  Insight:  Fair  Psychomotor Activity:  Normal  Concentration:  Fair  Recall:  Good  Akathisia:  No  Handed:  Right  AIMS (if indicated):  Not indicated  Assets:  Communication Skills   language intact  fund of knowledge is average       Assessment: Axis I: Obsessive Compulsive Disorder-. ADHD. Grief. Anxiety or panic due to stress   Plan:   Plan of Care:  PLAN:  1. Affirm with the patient that the medications are taken as ordered. Patient  expressed understanding of how their medications were to be used.    Laboratory:   No labs warranted at this time.   Psychotherapy: Therapy: brief supportive therapy provided.  Discussed psychosocial stressors in detail. More than 50% of the visit was spent on individual therapy/counseling.    Medications:  Continue the following psychiatric medications as written prior to this appointment with the following changes::  OCD: continue  Luvox to 100mg  2 tablets. Grief: better GAD ; anxiety fluctuates wil change neurontin to 200mg  at night.   Continue lamictacl. No rash reported  ADHD: remains concerning. Will add adderall 5mg  xr qd.  -Risks and benefits, side effects and alternatives discussed with patient, he/she was given an opportunity to ask questions about his/her medication, illness, and treatment. All current psychiatric medications have been reviewed and discussed with the patient and adjusted as clinically appropriate. The patient has been provided an accurate and updated list of the medications being now prescribed.   Routine PRN Medications:  Negative  Consultations: The patient was encouraged to keep all PCP and specialty clinic appointments.       Time Spent: 25 minutes  Merian Capron, MD 10/27/2015

## 2015-11-09 ENCOUNTER — Other Ambulatory Visit (HOSPITAL_COMMUNITY): Payer: Self-pay | Admitting: Psychiatry

## 2015-11-17 ENCOUNTER — Ambulatory Visit (HOSPITAL_COMMUNITY): Payer: Self-pay | Admitting: Psychiatry

## 2015-11-18 ENCOUNTER — Telehealth (HOSPITAL_COMMUNITY): Payer: Self-pay | Admitting: *Deleted

## 2015-11-18 ENCOUNTER — Ambulatory Visit (INDEPENDENT_AMBULATORY_CARE_PROVIDER_SITE_OTHER): Payer: BC Managed Care – PPO | Admitting: Psychiatry

## 2015-11-18 ENCOUNTER — Encounter (HOSPITAL_COMMUNITY): Payer: Self-pay | Admitting: Psychiatry

## 2015-11-18 VITALS — BP 124/72 | HR 78 | Ht 62.0 in | Wt 163.0 lb

## 2015-11-18 DIAGNOSIS — F902 Attention-deficit hyperactivity disorder, combined type: Secondary | ICD-10-CM | POA: Diagnosis not present

## 2015-11-18 DIAGNOSIS — F410 Panic disorder [episodic paroxysmal anxiety] without agoraphobia: Secondary | ICD-10-CM | POA: Diagnosis not present

## 2015-11-18 DIAGNOSIS — F430 Acute stress reaction: Secondary | ICD-10-CM

## 2015-11-18 DIAGNOSIS — F429 Obsessive-compulsive disorder, unspecified: Secondary | ICD-10-CM | POA: Diagnosis not present

## 2015-11-18 MED ORDER — LAMOTRIGINE 200 MG PO TABS: 200.0000 mg | ORAL_TABLET | Freq: Every day | ORAL | Status: DC

## 2015-11-18 MED ORDER — FLUVOXAMINE MALEATE 100 MG PO TABS: ORAL_TABLET | ORAL | Status: DC

## 2015-11-18 MED ORDER — GABAPENTIN 300 MG PO CAPS: 300.0000 mg | ORAL_CAPSULE | Freq: Every day | ORAL | Status: DC

## 2015-11-18 MED ORDER — LISDEXAMFETAMINE DIMESYLATE 20 MG PO CAPS: 20.0000 mg | ORAL_CAPSULE | ORAL | Status: DC

## 2015-11-18 NOTE — Progress Notes (Signed)
Patient ID: Jennifer Singh, female   DOB: Sep 12, 1990, 25 y.o.   MRN: [id number]   [facility] [address] Progress Note  Jennifer Singh [id number] 25 y.o.  11/18/2015  8:59 AM  Chief Complaint:  HPI Comments: [name] is  a 25 y/o female with a past psychiatric history significant for Obsessive Compulsive Disorder, ADHD. The patient returns for psychiiatric services and  medication management.    Last visit we increased gabapentin to 3 mg either self We also added Adderall for ADHD but apparently that caused more anxiety and restlessness and some agitation she is wanting to change it to something different  Has difficulty multi tasking   Denies alcohol use recently Obsessions are better since /fluvoxamine was increased. Not checking on doors.  Aggravating factors: work stress. Fathers anniversary of death   . Severity: Depression: 5/10 (0=Very depressed; 5=Neutral; 10=Very Happy)  Anxiety  6/10 (0=no anxiety; 5= moderate/tolerable anxiety; 10= panic attacks)  . Duration-Since she was 6 years  . Timing: depending upon stress level.   . Context; Obsessive thoughts, and compulsions. Fathers death.   . Modifying factors: meds . Family support  No psychotic or manic symptoms.     Past Medical Family, Social History:  Past Medical History  Diagnosis Date  . ADHD (attention deficit hyperactivity disorder)   . Anxiety   . Obsessive-compulsive disorder   . Oppositional defiant disorder   . Dysmenorrhea   . Central auditory processing disorder (CAPD)    Family History  Problem Relation Age of Onset  . ADD / ADHD Mother   . Depression Maternal Uncle   . Alcohol abuse Maternal Grandfather   . OCD Maternal Grandmother   . Thyroid disease Brother   . Hypertension Brother   . Hyperlipidemia Brother   . Diabetes Brother   . Hyperlipidemia Father   . Hypertension Father   . Heart attack Neg Hx    Social History   Social History  . Marital Status: Single     Spouse Name: N/A  . Number of Children: N/A  . Years of Education: N/A   Social History Main Topics  . Smoking status: Current Every Day Smoker -- 1.00 packs/day for 2 years    Types: Cigarettes  . Smokeless tobacco: Never Used  . Alcohol Use: No     Comment: 0  . Drug Use: No  . Sexual Activity: Yes     Comment: lesbian   Other Topics Concern  . None   Social History Narrative    Outpatient Encounter Prescriptions as of 11/18/2015  Medication Sig  . acetaminophen (TYLENOL) 325 MG tablet Take by mouth. Reported on 10/27/2015  . albuterol (PROVENTIL HFA;VENTOLIN HFA) 108 (90 Base) MCG/ACT inhaler Inhale 2 puffs into the lungs every 6 (six) hours as needed for wheezing or shortness of breath.  . beclomethasone (QVAR) 80 MCG/ACT inhaler Inhale 1 puff into the lungs 2 (two) times daily.  . fluvoxaMINE (LUVOX) 100 MG tablet TAKE 2 a day,  total of (200mg )  . gabapentin (NEURONTIN) 300 MG capsule Take 1 capsule (300 mg total) by mouth at bedtime.  Marland Kitchen HYDROcodone-homatropine (HYCODAN) 5-1.5 MG/5ML syrup Take 5 mLs by mouth every 8 (eight) hours as needed for cough.  . lamoTRIgine (LAMICTAL) 200 MG tablet Take 1 tablet (200 mg total) by mouth daily.  . [DISCONTINUED] amphetamine-dextroamphetamine (ADDERALL XR) 5 MG 24 hr capsule Take 1 capsule (5 mg total) by mouth daily.  . [DISCONTINUED] fluvoxaMINE (LUVOX) 100 MG tablet TAKE 2  a day,  total of (200mg )  . [DISCONTINUED] gabapentin (NEURONTIN) 100 MG capsule Take 2 capsules (200 mg total) by mouth at bedtime.  . [DISCONTINUED] lamoTRIgine (LAMICTAL) 150 MG tablet Take 1 tablet (150 mg total) by mouth daily.  Marland Kitchen lisdexamfetamine (VYVANSE) 20 MG capsule Take 1 capsule (20 mg total) by mouth every morning.   No facility-administered encounter medications on file as of 11/18/2015.      Review of Systems: Review of Systems  Constitutional: Positive for malaise/fatigue. Negative for fever and weight loss.  Cardiovascular: Negative for chest  pain and palpitations.  Gastrointestinal: Negative for nausea.  Skin: Negative for itching and rash.  Neurological: Negative for tingling, tremors and headaches.  Psychiatric/Behavioral: Negative for suicidal ideas, hallucinations and substance abuse. The patient is nervous/anxious.      Neurologic: Headache: Negative Seizure: Negative Paresthesias: Negative Physical Exam: Constitutional:  BP 124/72 mmHg  Pulse 78  Ht 5\' 2"  (1.575 m)  Wt 163 lb (73.936 kg)  BMI 29.81 kg/m2  SpO2 97%  General Appearance: alert, oriented, no acute distress  Musculoskeletal: Gait & Station: normal Patient leans: Right  Psychiatric Specialty Exam:  Psychiatric Specialty Exam:   General Appearance: Casual and Well Groomed  Eye Contact::  Good  Speech:  Clear and Coherent and Normal Rate  Volume:  Normal  Mood: fair with some anxiety  Affect:  Appropriate, Congruent and Full Range  Thought Process:  Coherent, Goal Directed, Linear and Logical  Orientation:  Full (Time, Place, and Person)  Thought Content:  WDL  Suicidal Thoughts:  No  Homicidal Thoughts:  No  Memory:  Immediate;   Good Recent;   Good Remote;   Good  Judgement:  Fair  Insight:  Fair  Psychomotor Activity:  Normal  Concentration:  Fair  Recall:  Good  Akathisia:  No  Handed:  Right  AIMS (if indicated):  Not indicated  Assets:  Communication Skills   language intact  fund of knowledge is average       Assessment: Axis I: Obsessive Compulsive Disorder-. ADHD. Grief. Anxiety or panic due to stress   Plan:   Plan of Care:  PLAN:  1. Affirm with the patient that the medications are taken as ordered. Patient  expressed understanding of how their medications were to be used.    Laboratory:   No labs warranted at this time.   Psychotherapy: Therapy: brief supportive therapy provided.  Discussed psychosocial stressors in detail. More than 50% of the visit was spent on individual therapy/counseling.    Medications:  Continue the following psychiatric medications as written prior to this appointment with the following changes::  OCD: continue  Luvox to 100mg  2 tablets. Grief: better GAD ; anxiety fluctuates wil change neurontin to 300mg  at night.  Once capsule  Increase lamictal to 200mg  qd for mood stabilization.  No rash reported  ADHD: remains concerning. Will change to vyvanse 20mg  . DC adderall for concern of agitatation.   -Risks and benefits, side effects and alternatives discussed with patient, he/she was given an opportunity to ask questions about his/her medication, illness, and treatment. All current psychiatric medications have been reviewed and discussed with the patient and adjusted as clinically appropriate. The patient has been provided an accurate and updated list of the medications being now prescribed.   Routine PRN Medications:  Negative  Consultations: The patient was encouraged to keep all PCP and specialty clinic appointments.   Follow up in 3 weeks     Time Spent: 25 minutes  Merian Capron, MD 11/18/2015

## 2015-11-18 NOTE — Telephone Encounter (Signed)
Faxed received for a prior authorization on Vyvanse. PA was sent [facility].

## 2015-11-19 ENCOUNTER — Telehealth (HOSPITAL_COMMUNITY): Payer: Self-pay | Admitting: *Deleted

## 2015-11-19 NOTE — Telephone Encounter (Signed)
Noted  

## 2015-11-19 NOTE — Telephone Encounter (Signed)
Prior authorization for Vyvanse received. Called [phone] spoke with Arnette Norris. Approval given until 11/18/18 [id number]. Called to notify pharmacy.

## 2015-11-23 NOTE — Telephone Encounter (Signed)
Received fax from [facility] requesting refill for Lamictal 150mg . Per Dr. De Nurse, refill request is denied. Per provider's note, dosage increase on 11/18/15 to Lamictal 200mg , 330. Pt received a printed rx on 11/18/15. Pt is schedule for a f/u appt on 12/10/15

## 2015-11-30 ENCOUNTER — Ambulatory Visit (HOSPITAL_COMMUNITY): Payer: Self-pay | Admitting: Licensed Clinical Social Worker

## 2015-12-10 ENCOUNTER — Ambulatory Visit (HOSPITAL_COMMUNITY): Payer: Self-pay | Admitting: Psychiatry

## 2015-12-20 ENCOUNTER — Other Ambulatory Visit (HOSPITAL_COMMUNITY): Payer: Self-pay | Admitting: Psychiatry

## 2015-12-21 NOTE — Telephone Encounter (Signed)
Received fax from [facility] requesting a refill for Gabapentin. Per Dr. De Nurse, refill request is denied. Pt will need to schedule an appt with clinic. Lvm for pt to return call to office.

## 2016-01-08 ENCOUNTER — Institutional Professional Consult (permissible substitution): Payer: Self-pay | Admitting: Pulmonary Disease

## 2016-02-02 ENCOUNTER — Encounter (HOSPITAL_COMMUNITY): Payer: Self-pay | Admitting: Psychiatry

## 2016-02-02 ENCOUNTER — Ambulatory Visit (INDEPENDENT_AMBULATORY_CARE_PROVIDER_SITE_OTHER): Payer: BC Managed Care – PPO | Admitting: Psychiatry

## 2016-02-02 DIAGNOSIS — F902 Attention-deficit hyperactivity disorder, combined type: Secondary | ICD-10-CM | POA: Diagnosis not present

## 2016-02-02 DIAGNOSIS — F4321 Adjustment disorder with depressed mood: Secondary | ICD-10-CM

## 2016-02-02 DIAGNOSIS — F410 Panic disorder [episodic paroxysmal anxiety] without agoraphobia: Secondary | ICD-10-CM | POA: Diagnosis not present

## 2016-02-02 DIAGNOSIS — F429 Obsessive-compulsive disorder, unspecified: Secondary | ICD-10-CM | POA: Diagnosis not present

## 2016-02-02 DIAGNOSIS — F430 Acute stress reaction: Principal | ICD-10-CM

## 2016-02-02 MED ORDER — LAMOTRIGINE 200 MG PO TABS: 200.0000 mg | ORAL_TABLET | Freq: Every day | ORAL | 0 refills | Status: DC

## 2016-02-02 MED ORDER — CLONAZEPAM 0.5 MG PO TABS: 0.5000 mg | ORAL_TABLET | Freq: Every day | ORAL | 0 refills | Status: DC | PRN

## 2016-02-02 MED ORDER — FLUVOXAMINE MALEATE 100 MG PO TABS: ORAL_TABLET | ORAL | 0 refills | Status: DC

## 2016-02-02 NOTE — Progress Notes (Signed)
Patient ID: Jennifer Singh, female   DOB: 1991-04-12, 25 y.o.   MRN: [id number]   [facility] [id number] Progress Note  Jennifer Singh [id number] 25 y.o.  11/18/2015  1:53 PM  Chief Complaint:  HPI Comments: Jennifer Singh is  a 25 y/o female with a past psychiatric history significant for Obsessive Compulsive Disorder, ADHD. The patient returns for psychiiatric services and  medication management.    Last visit we increased gabapentin . Says doesn't help anxiety. She stopped Also believes Vyvanse is not working so she stopped that. She appears to be more overwhelmed says that she gets easily agitated so I cautioned that a stimulant medication may make it worse she has had history of being diagnosed bipolar she continues to take Lamictal but feeling overly anxious and moody. Says klonopine helped with this combination in past.    Denies alcohol use recently Obsessions are not worsned since /fluvoxamine was increased. Not checking on doors.  Aggravating factors: work stress. Fathers anniversary of death   . Severity: Depression: 5/10 (0=Very depressed; 5=Neutral; 10=Very Happy)  Anxiety  6/10 (0=no anxiety; 5= moderate/tolerable anxiety; 10= panic attacks)  . Duration-Since she was 6 years  . Timing: depending upon stress level.   . Context; Obsessive thoughts, and compulsions. Fathers death.   . Modifying factors: meds . Family support  No psychotic or manic symptoms.     Past Medical Family, Social History:  Past Medical History:  Diagnosis Date  . ADHD (attention deficit hyperactivity disorder)   . Anxiety   . Central auditory processing disorder (CAPD)   . Dysmenorrhea   . Obsessive-compulsive disorder   . Oppositional defiant disorder    Family History  Problem Relation Age of Onset  . ADD / ADHD Mother   . Thyroid disease Brother   . Hypertension Brother   . Hyperlipidemia Brother   . Diabetes Brother   . Hyperlipidemia Father   . Hypertension Father    . Depression Maternal Uncle   . Alcohol abuse Maternal Grandfather   . OCD Maternal Grandmother   . Heart attack Neg Hx    Social History   Social History  . Marital status: Single    Spouse name: N/A  . Number of children: N/A  . Years of education: N/A   Social History Main Topics  . Smoking status: Current Every Day Smoker    Packs/day: 1.00    Years: 2.00    Types: Cigarettes  . Smokeless tobacco: Never Used  . Alcohol use No     Comment: 0  . Drug use: No  . Sexual activity: Yes     Comment: lesbian   Other Topics Concern  . None   Social History Narrative  . None    Outpatient Encounter Prescriptions as of 02/02/2016  Medication Sig  . acetaminophen (TYLENOL) 325 MG tablet Take by mouth. Reported on 10/27/2015  . albuterol (PROVENTIL HFA;VENTOLIN HFA) 108 (90 Base) MCG/ACT inhaler Inhale 2 puffs into the lungs every 6 (six) hours as needed for wheezing or shortness of breath.  . beclomethasone (QVAR) 80 MCG/ACT inhaler Inhale 1 puff into the lungs 2 (two) times daily.  . clonazePAM (KLONOPIN) 0.5 MG tablet Take 1 tablet (0.5 mg total) by mouth daily as needed for anxiety.  . fluvoxaMINE (LUVOX) 100 MG tablet TAKE 2 a day,  total of (200mg )  . lamoTRIgine (LAMICTAL) 200 MG tablet Take 1 tablet (200 mg total) by mouth daily.  . [DISCONTINUED] fluvoxaMINE (LUVOX) 100 MG  tablet TAKE 2 a day,  total of (200mg )  . [DISCONTINUED] gabapentin (NEURONTIN) 300 MG capsule Take 1 capsule (300 mg total) by mouth at bedtime.  . [DISCONTINUED] HYDROcodone-homatropine (HYCODAN) 5-1.5 MG/5ML syrup Take 5 mLs by mouth every 8 (eight) hours as needed for cough.  . [DISCONTINUED] lamoTRIgine (LAMICTAL) 200 MG tablet Take 1 tablet (200 mg total) by mouth daily.  . [DISCONTINUED] lisdexamfetamine (VYVANSE) 20 MG capsule Take 1 capsule (20 mg total) by mouth every morning.   No facility-administered encounter medications on file as of 02/02/2016.       Review of Systems: Review of  Systems  Constitutional: Positive for malaise/fatigue. Negative for fever and weight loss.  Cardiovascular: Negative for chest pain.  Gastrointestinal: Negative for nausea.  Skin: Negative for itching and rash.  Neurological: Negative for tingling, tremors and headaches.  Psychiatric/Behavioral: Negative for hallucinations, substance abuse and suicidal ideas. The patient is nervous/anxious.      Neurologic: Headache: Negative Seizure: Negative Paresthesias: Negative Physical Exam: Constitutional:  There were no vitals taken for this visit.  General Appearance: alert, oriented, no acute distress  Musculoskeletal: Gait & Station: normal Patient leans: Right  Psychiatric Specialty Exam:  Psychiatric Specialty Exam:   General Appearance: Casual and Well Groomed  Eye Contact::  Good  Speech:  Clear and Coherent and Normal Rate  Volume:  Normal  Mood: anxious, stressed  Affect:  Appropriate, Congruent and Full Range  Thought Process:  Coherent, Goal Directed, Linear and Logical  Orientation:  Full (Time, Place, and Person)  Thought Content:  WDL  Suicidal Thoughts:  No  Homicidal Thoughts:  No  Memory:  Immediate;   Good Recent;   Good Remote;   Good  Judgement:  Fair  Insight:  Fair  Psychomotor Activity:  Normal  Concentration:  Fair  Recall:  Good  Akathisia:  No  Handed:  Right  AIMS (if indicated):  Not indicated  Assets:  Communication Skills   language intact  fund of knowledge is average       Assessment: Axis I: Obsessive Compulsive Disorder-. ADHD. Grief. Anxiety or panic due to stress   Plan:   Plan of Care:  PLAN:  1. Affirm with the patient that the medications are taken as ordered. Patient  expressed understanding of how their medications were to be used.    Laboratory:   No labs warranted at this time.   Psychotherapy: Therapy: brief supportive therapy provided.  Discussed psychosocial stressors in detail. More than 50% of the visit was  spent on individual therapy/counseling.   Medications:  Continue the following psychiatric medications as written prior to this appointment with the following changes::  OCD: continue  Luvox to 100mg  2 tablets. Grief: handling it ok. GAD ; anxiety fluctuates . Will add klonopine 0.5mg  qd prn.   Increase lamictal to 200mg  qd for mood stabilization.  No rash reported  ADHD: need to work on mood symptoms and anxiety first whic may be contributing to distraction.  -Risks and benefits, side effects and alternatives discussed with patient, he/she was given an opportunity to ask questions about his/her medication, illness, and treatment. All current psychiatric medications have been reviewed and discussed with the patient and adjusted as clinically appropriate. The patient has been provided an accurate and updated list of the medications being now prescribed.   Routine PRN Medications:  Negative  Consultations: The patient was encouraged to keep all PCP and specialty clinic appointments.   Follow up in 4 weeks  Time Spent: 25 minutes  Merian Capron, MD 02/02/2016

## 2016-02-26 ENCOUNTER — Ambulatory Visit (HOSPITAL_COMMUNITY): Payer: Self-pay | Admitting: Psychiatry

## 2016-03-14 ENCOUNTER — Institutional Professional Consult (permissible substitution): Payer: Self-pay | Admitting: Pulmonary Disease

## 2016-04-30 ENCOUNTER — Emergency Department (HOSPITAL_BASED_OUTPATIENT_CLINIC_OR_DEPARTMENT_OTHER)
Admission: EM | Admit: 2016-04-30 | Discharge: 2016-04-30 | Disposition: A | Discharge status: Home / Self Care | Payer: BC Managed Care – PPO | Attending: Emergency Medicine | Admitting: Emergency Medicine

## 2016-04-30 ENCOUNTER — Encounter (HOSPITAL_BASED_OUTPATIENT_CLINIC_OR_DEPARTMENT_OTHER): Payer: Self-pay | Admitting: Emergency Medicine

## 2016-04-30 DIAGNOSIS — A084 Viral intestinal infection, unspecified: Secondary | ICD-10-CM | POA: Insufficient documentation

## 2016-04-30 DIAGNOSIS — F17210 Nicotine dependence, cigarettes, uncomplicated: Secondary | ICD-10-CM | POA: Diagnosis not present

## 2016-04-30 DIAGNOSIS — Z79899 Other long term (current) drug therapy: Secondary | ICD-10-CM | POA: Insufficient documentation

## 2016-04-30 DIAGNOSIS — F909 Attention-deficit hyperactivity disorder, unspecified type: Secondary | ICD-10-CM | POA: Diagnosis not present

## 2016-04-30 DIAGNOSIS — R112 Nausea with vomiting, unspecified: Secondary | ICD-10-CM | POA: Diagnosis present

## 2016-04-30 LAB — URINALYSIS, ROUTINE W REFLEX MICROSCOPIC
Bilirubin Urine: NEGATIVE
Glucose, UA: NEGATIVE mg/dL
Hgb urine dipstick: NEGATIVE
Ketones, ur: NEGATIVE mg/dL
Leukocytes, UA: NEGATIVE
Nitrite: NEGATIVE
Protein, ur: NEGATIVE mg/dL
Specific Gravity, Urine: 1.022 (ref 1.005–1.030)
pH: 6.5 (ref 5.0–8.0)

## 2016-04-30 LAB — PREGNANCY, URINE: Preg Test, Ur: NEGATIVE

## 2016-04-30 MED ORDER — ONDANSETRON HCL 4 MG/2ML IJ SOLN
4.0000 mg | Freq: Once | INTRAMUSCULAR | Status: AC
  Administered 2016-04-30: 4 mg via INTRAVENOUS
  Filled 2016-04-30: qty 2

## 2016-04-30 MED ORDER — ONDANSETRON 8 MG PO TBDP: 8.0000 mg | ORAL_TABLET | Freq: Three times a day (TID) | ORAL | 0 refills | Status: DC | PRN

## 2016-04-30 MED ORDER — LOPERAMIDE HCL 2 MG PO CAPS
4.0000 mg | ORAL_CAPSULE | Freq: Once | ORAL | Status: AC
  Administered 2016-04-30: 4 mg via ORAL
  Filled 2016-04-30: qty 2

## 2016-04-30 MED ORDER — SODIUM CHLORIDE 0.9 % IV BOLUS (SEPSIS)
1000.0000 mL | Freq: Once | INTRAVENOUS | Status: AC
  Administered 2016-04-30: 1000 mL via INTRAVENOUS

## 2016-04-30 NOTE — ED Notes (Signed)
ED Provider at bedside. 

## 2016-04-30 NOTE — ED Provider Notes (Signed)
[facility] Provider Note: Georgena Spurling, MD, FACEP  CSN: [id number] MRN: [id number] ARRIVAL: 04/30/16 at [facility]  Vomiting and Diarrhea   HISTORY OF PRESENT ILLNESS  Jennifer Singh is a 25 y.o. female with nausea, vomiting and diarrhea that began yesterday morning about 9 AM. Nausea and vomiting are exacerbated by the smell of food. She describes her diarrhea as watery and has had incontinence of liquid stool. She denies fever or abdominal pain.   Past Medical History:  Diagnosis Date  . ADHD (attention deficit hyperactivity disorder)   . Anxiety   . Central auditory processing disorder (CAPD)   . Dysmenorrhea   . Obsessive-compulsive disorder   . Oppositional defiant disorder     Past Surgical History:  Procedure Laterality Date  . WISDOM TOOTH EXTRACTION      Family History  Problem Relation Age of Onset  . ADD / ADHD Mother   . Thyroid disease Brother   . Hypertension Brother   . Hyperlipidemia Brother   . Diabetes Brother   . Hyperlipidemia Father   . Hypertension Father   . Depression Maternal Uncle   . Alcohol abuse Maternal Grandfather   . OCD Maternal Grandmother   . Heart attack Neg Hx     Social History  Substance Use Topics  . Smoking status: Current Every Day Smoker    Packs/day: 1.00    Years: 2.00    Types: Cigarettes  . Smokeless tobacco: Never Used  . Alcohol use No     Comment: 0    Prior to Admission medications   Medication Sig Start Date End Date Taking? Authorizing Provider  acetaminophen (TYLENOL) 325 MG tablet Take by mouth. Reported on 10/27/2015    Historical Provider, MD  albuterol (PROVENTIL HFA;VENTOLIN HFA) 108 (90 Base) MCG/ACT inhaler Inhale 2 puffs into the lungs every 6 (six) hours as needed for wheezing or shortness of breath. 05/19/15   Jade L Breeback, PA-C  beclomethasone (QVAR) 80 MCG/ACT inhaler Inhale 1 puff into the lungs 2 (two) times daily. 05/19/15   Jade L Breeback,  PA-C  clonazePAM (KLONOPIN) 0.5 MG tablet Take 1 tablet (0.5 mg total) by mouth daily as needed for anxiety. 02/02/16   Merian Capron, MD  fluvoxaMINE (LUVOX) 100 MG tablet TAKE 2 a day,  total of (200mg ) 02/02/16   Merian Capron, MD  lamoTRIgine (LAMICTAL) 200 MG tablet Take 1 tablet (200 mg total) by mouth daily. 02/02/16   Merian Capron, MD    Allergies Ciprofloxacin and Penicillins   REVIEW OF SYSTEMS  Negative except as noted here or in the History of Present Illness.   PHYSICAL EXAMINATION  Initial Vital Signs Blood pressure 140/84, pulse 87, temperature 98.8 F (37.1 C), temperature source Oral, resp. rate 20, height 5\' 2"  (1.575 m), weight 165 lb (74.8 kg), SpO2 100 %.  Examination General: Well-developed, well-nourished female in no acute distress; appearance consistent with age of record HENT: normocephalic; atraumatic Eyes: pupils equal, round and reactive to light; extraocular muscles intact Neck: supple Heart: regular rate and rhythm Lungs: clear to auscultation bilaterally Abdomen: soft; nondistended; nontender; no masses or hepatosplenomegaly; bowel sounds present Extremities: No deformity; full range of motion; pulses normal Neurologic: Awake, alert and oriented; motor function intact in all extremities and symmetric; no facial droop Skin: Warm and dry Psychiatric: Normal mood and affect   RESULTS  Summary of this visit's results, reviewed by myself:   EKG Interpretation  Date/Time:  Ventricular Rate:    PR Interval:    QRS Duration:   QT Interval:    QTC Calculation:   R Axis:     Text Interpretation:        Laboratory Studies: Results for orders placed or performed during the hospital encounter of 04/30/16 (from the past 24 hour(s))  Urinalysis, Routine w reflex microscopic     Status: None   Collection Time: 04/30/16  2:53 AM  Result Value Ref Range   Color, Urine YELLOW YELLOW   APPearance CLEAR CLEAR   Specific Gravity, Urine 1.022 1.005 -  1.030   pH 6.5 5.0 - 8.0   Glucose, UA NEGATIVE NEGATIVE mg/dL   Hgb urine dipstick NEGATIVE NEGATIVE   Bilirubin Urine NEGATIVE NEGATIVE   Ketones, ur NEGATIVE NEGATIVE mg/dL   Protein, ur NEGATIVE NEGATIVE mg/dL   Nitrite NEGATIVE NEGATIVE   Leukocytes, UA NEGATIVE NEGATIVE  Pregnancy, urine     Status: None   Collection Time: 04/30/16  2:53 AM  Result Value Ref Range   Preg Test, Ur NEGATIVE NEGATIVE   Imaging Studies: No results found.  ED COURSE  Nursing notes and initial vitals signs, including pulse oximetry, reviewed.  Vitals:   04/30/16 0107 04/30/16 0108 04/30/16 0343  BP: 140/84  116/75  Pulse: 87  83  Resp: 20  20  Temp: 98.8 F (37.1 C)    TempSrc: Oral    SpO2: 100%  100%  Weight:  165 lb (74.8 kg)   Height:  5\' 2"  (1.575 m)    4:15 AM Drinking fluids without emesis. No diarrhea while in ED. Patient was advised to take over-the-counter Imodium as needed for diarrhea.  PROCEDURES    ED DIAGNOSES     ICD-9-CM ICD-10-CM   1. Viral gastroenteritis 008.8 A08.4        Shanon Rosser, MD 04/30/16 [phone]

## 2016-10-13 ENCOUNTER — Emergency Department (HOSPITAL_COMMUNITY)
Admission: EM | Admit: 2016-10-13 | Discharge: 2016-10-13 | Disposition: A | Discharge status: Home / Self Care | Payer: BC Managed Care – PPO | Attending: Emergency Medicine | Admitting: Emergency Medicine

## 2016-10-13 ENCOUNTER — Encounter (HOSPITAL_COMMUNITY): Payer: Self-pay | Admitting: Emergency Medicine

## 2016-10-13 DIAGNOSIS — R55 Syncope and collapse: Secondary | ICD-10-CM | POA: Diagnosis not present

## 2016-10-13 DIAGNOSIS — R42 Dizziness and giddiness: Secondary | ICD-10-CM | POA: Diagnosis present

## 2016-10-13 DIAGNOSIS — Z79899 Other long term (current) drug therapy: Secondary | ICD-10-CM | POA: Diagnosis not present

## 2016-10-13 DIAGNOSIS — F909 Attention-deficit hyperactivity disorder, unspecified type: Secondary | ICD-10-CM | POA: Diagnosis not present

## 2016-10-13 DIAGNOSIS — R002 Palpitations: Secondary | ICD-10-CM | POA: Diagnosis not present

## 2016-10-13 DIAGNOSIS — F17210 Nicotine dependence, cigarettes, uncomplicated: Secondary | ICD-10-CM | POA: Insufficient documentation

## 2016-10-13 LAB — BASIC METABOLIC PANEL
Anion gap: 7 (ref 5–15)
BUN: 8 mg/dL (ref 6–20)
CO2: 25 mmol/L (ref 22–32)
Calcium: 9.4 mg/dL (ref 8.9–10.3)
Chloride: 107 mmol/L (ref 101–111)
Creatinine, Ser: 0.59 mg/dL (ref 0.44–1.00)
GFR calc Af Amer: >60 mL/min (ref >60)
GFR calc non Af Amer: >60 mL/min (ref >60)
Glucose, Bld: 92 mg/dL (ref 65–99)
Potassium: 4 mmol/L (ref 3.5–5.1)
Sodium: 139 mmol/L (ref 135–145)

## 2016-10-13 LAB — TSH: TSH: 1.693 u[IU]/mL (ref 0.350–4.500)

## 2016-10-13 LAB — PREGNANCY, URINE: Preg Test, Ur: NEGATIVE

## 2016-10-13 MED ORDER — SODIUM CHLORIDE 0.9 % IV BOLUS (SEPSIS)
1000.0000 mL | Freq: Once | INTRAVENOUS | Status: AC
  Administered 2016-10-13: 1000 mL via INTRAVENOUS

## 2016-10-13 NOTE — Discharge Instructions (Signed)
Please follow-up with the cardiologist for further evaluation and treatment of your palpitations by calling the number circled below. Please follow up and establish care with a primary care provider by calling the number outlined in circled and black in your discharge paperwork. Make sure to drink plenty of water, double the amount of watery or drinking now. Ask your doctor to help you quit smoking. Please return to emergency department if you develop any new or worsening symptoms.

## 2016-10-13 NOTE — ED Triage Notes (Signed)
Per EMS-states she just got to work and started becoming dizzy and nauseous-no LOC NSR on monitor-CBG 99-BP 114/88, HR 85-states she feels dizzy when she stands-positive for orthostatics during initial evaluation-states she took her Xanax at 0700

## 2016-10-13 NOTE — ED Provider Notes (Signed)
Patient became lightheaded at work today while standing and "collapsed" with her knees buckling causing her to follow 4. No injury. She feels much improved since here. She denies any headache denies abdominal pain denies chest pain. She does at times feel her heart racing. On exam patient is alert and in no distress lungs clear auscultation heart regular rate and rhythm no murmurs abdomen obese, nontender extremities without edema. She is mildly lightheaded on standing after treatment with 1 L normal saline. Encourage oral hydration. I Counseled patient for 5 minutes on smoking cessation   Orlie Dakin, MD 10/13/16 1432

## 2016-10-13 NOTE — ED Provider Notes (Signed)
[facility] DEPT Provider Note   CSN: [id number] Arrival date & time: 10/13/16  1034     History   Chief Complaint Chief Complaint  Patient presents with  . Dizziness    HPI Jennifer Singh is a 26 y.o. female who presents following near syncopal episode. Patient states she was at work today and started feeling very tired and like her legs are going to give out. Patient then collapsed. She did not lose consciousness or hit her head. Patient reports she has been having a palpitation sensation in her chest for the past 2 or 3 weeks. Her primary care provider started her on Xanax suspecting it may be related to panic attacks. Patient feels that her symptoms have not improved since starting the medication, but maybe worsened since initiation. Patient denies any chest pain, shortness of breath, abdominal pain, nausea, vomiting, urinary symptoms.Patient reportedly orthostatic for EMS.  HPI  Past Medical History:  Diagnosis Date  . ADHD (attention deficit hyperactivity disorder)   . Anxiety   . Central auditory processing disorder (CAPD)   . Dysmenorrhea   . Obsessive-compulsive disorder   . Oppositional defiant disorder     Patient Active Problem List   Diagnosis Date Noted  . Weight gain 12/25/2013  . Left knee pain 05/08/2013  . OCD (obsessive compulsive disorder) 06/08/2011    Past Surgical History:  Procedure Laterality Date  . WISDOM TOOTH EXTRACTION      OB History    Gravida Para Term Preterm AB Living   0             SAB TAB Ectopic Multiple Live Births                   Home Medications    Prior to Admission medications   Medication Sig Start Date End Date Taking? Authorizing Provider  acetaminophen (TYLENOL) 325 MG tablet Take by mouth. Reported on 10/27/2015   Yes [provider]  ALPRAZolam Duanne Moron) 0.5 MG tablet Take 0.5 mg by mouth at bedtime as needed for anxiety.   Yes [provider]  lamoTRIgine (LAMICTAL) 200 MG tablet Take 1  tablet (200 mg total) by mouth daily. 02/02/16  Yes Merian Capron, MD  zaleplon (SONATA) 5 MG capsule Take 5 mg by mouth at bedtime as needed for sleep.   Yes [provider]  albuterol (PROVENTIL HFA;VENTOLIN HFA) 108 (90 Base) MCG/ACT inhaler Inhale 2 puffs into the lungs every 6 (six) hours as needed for wheezing or shortness of breath. Patient not taking: Reported on 10/13/2016 05/19/15   Donella Stade, PA-C  beclomethasone (QVAR) 80 MCG/ACT inhaler Inhale 1 puff into the lungs 2 (two) times daily. Patient not taking: Reported on 10/13/2016 05/19/15   Donella Stade, PA-C  clonazePAM (KLONOPIN) 0.5 MG tablet Take 1 tablet (0.5 mg total) by mouth daily as needed for anxiety. Patient not taking: Reported on 10/13/2016 02/02/16   Merian Capron, MD  fluvoxaMINE (LUVOX) 100 MG tablet TAKE 2 a day,  total of (200mg ) Patient not taking: Reported on 10/13/2016 02/02/16   Merian Capron, MD  ondansetron (ZOFRAN ODT) 8 MG disintegrating tablet Take 1 tablet (8 mg total) by mouth every 8 (eight) hours as needed for nausea or vomiting. Patient not taking: Reported on 10/13/2016 04/30/16   Molpus, Jenny Reichmann, MD    Family History Family History  Problem Relation Age of Onset  . ADD / ADHD Mother   . Thyroid disease Brother   . Hypertension Brother   .  Hyperlipidemia Brother   . Diabetes Brother   . Hyperlipidemia Father   . Hypertension Father   . Depression Maternal Uncle   . Alcohol abuse Maternal Grandfather   . OCD Maternal Grandmother   . Heart attack Neg Hx     Social History Social History  Substance Use Topics  . Smoking status: Current Every Day Smoker    Packs/day: 1.00    Years: 2.00    Types: Cigarettes  . Smokeless tobacco: Never Used  . Alcohol use No     Comment: 0     Allergies   Ciprofloxacin and Penicillins   Review of Systems Review of Systems  Constitutional: Negative for chills and fever.  HENT: Negative for facial swelling and sore throat.   Respiratory:  Negative for shortness of breath.   Cardiovascular: Positive for palpitations. Negative for chest pain.  Gastrointestinal: Negative for abdominal pain, nausea and vomiting.  Genitourinary: Negative for dysuria.  Musculoskeletal: Negative for back pain.  Skin: Negative for rash and wound.  Neurological: Positive for syncope (near), weakness (generalized) and light-headedness. Negative for headaches.  Psychiatric/Behavioral: The patient is not nervous/anxious.      Physical Exam Updated Vital Signs BP 124/82   Pulse 76   Temp 98.9 F (37.2 C) (Oral)   Resp 19   Ht 5' (1.524 m)   Wt 69.4 kg (153 lb)   SpO2 100%   BMI 29.88 kg/m   Physical Exam  Constitutional: She appears well-developed and well-nourished. No distress.  HENT:  Head: Normocephalic and atraumatic.  Mouth/Throat: Oropharynx is clear and moist. No oropharyngeal exudate.  Eyes: Conjunctivae and EOM are normal. Pupils are equal, round, and reactive to light. Right eye exhibits no discharge. Left eye exhibits no discharge. No scleral icterus.  Neck: Normal range of motion. Neck supple. No thyromegaly present.  Cardiovascular: Normal rate, regular rhythm, normal heart sounds and intact distal pulses.  Exam reveals no gallop and no friction rub.   No murmur heard. Pulmonary/Chest: Effort normal and breath sounds normal. No stridor. No respiratory distress. She has no wheezes. She has no rales.  Abdominal: Soft. Bowel sounds are normal. She exhibits no distension. There is no tenderness. There is no rebound and no guarding.  Musculoskeletal: She exhibits no edema.  Lymphadenopathy:    She has no cervical adenopathy.  Neurological: She is alert. Coordination normal.  CN 3-12 intact; normal sensation throughout; 5/5 strength in all 4 extremities; equal bilateral grip strength  Skin: Skin is warm and dry. No rash noted. She is not diaphoretic. No pallor.  Psychiatric: She has a normal mood and affect.  Nursing note and  vitals reviewed.    ED Treatments / Results  Labs (all labs ordered are listed, but only abnormal results are displayed) Labs Reviewed  PREGNANCY, URINE  BASIC METABOLIC PANEL  TSH    EKG  EKG Interpretation  Date/Time:  Thursday October 13 2016 12:54:12 EDT Ventricular Rate:  77 PR Interval:    QRS Duration: 89 QT Interval:  379 QTC Calculation: 429 R Axis:   77 Text Interpretation:  Sinus rhythm No old tracing to compare Confirmed by Orlie Dakin [phone]) on 10/13/2016 1:00:43 PM Also confirmed by Orlie Dakin [phone]), editor Verna Czech [phone])  on 10/13/2016 1:25:48 PM       Radiology No results found.  Procedures Procedures (including critical care time)  Medications Ordered in ED Medications  sodium chloride 0.9 % bolus 1,000 mL (0 mLs Intravenous Stopped 10/13/16 1502)  Initial Impression / Assessment and Plan / ED Course  I have reviewed the triage vital signs and the nursing notes.  Pertinent labs & imaging results that were available during my care of the patient were reviewed by me and considered in my medical decision making (see chart for details).     Patient with near syncopal episode 2-3 weeks of intermittent palpitations. EKG shows NSR. BMP, TSH within normal limits. Urine pregnancy negative. Patient only mildly lightheaded with standing after 1 L fluid bolus. No longer orthostatic. Patient encouraged to increase her fluid intake. She is advised to follow-up with cardiology and/or PCP for Holter monitor for her palpitations. Return precautions discussed. Patient understands and agrees with plan. Patient vitals stable throughout ED course discharged in satisfactory condition. Patient also evaluated by Dr. Winfred Leeds who guided the patient's management and agrees with plan.  Final Clinical Impressions(s) / ED Diagnoses   Final diagnoses:  Near syncope  Palpitations    New Prescriptions Discharge Medication List as of 10/13/2016  2:34 PM         Frederica Kuster, PA-C 10/13/16 1530    Orlie Dakin, MD 10/13/16 1601

## 2016-10-13 NOTE — ED Notes (Signed)
ED Provider at bedside. 

## 2016-10-18 ENCOUNTER — Ambulatory Visit (INDEPENDENT_AMBULATORY_CARE_PROVIDER_SITE_OTHER): Payer: BC Managed Care – PPO | Admitting: Cardiovascular Disease

## 2016-10-18 ENCOUNTER — Encounter: Payer: Self-pay | Admitting: Cardiovascular Disease

## 2016-10-18 VITALS — BP 118/68 | HR 86 | Ht 62.0 in | Wt 160.0 lb

## 2016-10-18 DIAGNOSIS — R55 Syncope and collapse: Secondary | ICD-10-CM | POA: Diagnosis not present

## 2016-10-18 DIAGNOSIS — R002 Palpitations: Secondary | ICD-10-CM | POA: Diagnosis not present

## 2016-10-18 NOTE — Assessment & Plan Note (Signed)
She was recently seen in the ER by Dr. Cathleen Fears after collapsing at work. Her workup was unremarkable. She did receive a liter of saline. She was hypotensive when EMS arrived. I suspect this is related to dehydration.

## 2016-10-18 NOTE — Assessment & Plan Note (Signed)
Wall history of palpitations more frequently recently. She does not drink caffeine. She drinks alcohol occasionally. She is not under a lot of stress. She does have ADHD. She is not on Adderall. I'm going to start function tests and a two-week event monitor.

## 2016-10-18 NOTE — Patient Instructions (Signed)
Medication Instructions: Your physician recommends that you continue on your current medications as directed. Please refer to the Current Medication list given to you today.  Labwork: Your physician recommends that you return for lab work: TSH, Free-T4   Testing/Procedures: Your physician has recommended that you wear a 14 day event monitor. Event monitors are medical devices that record the heart's electrical activity. Doctors most often [place] these monitors to diagnose arrhythmias. Arrhythmias are problems with the speed or rhythm of the heartbeat. The monitor is a small, portable device. You can wear one while you do your normal daily activities. This is usually used to diagnose what is causing palpitations/syncope (passing out).  Follow-Up: Your physician recommends that you schedule a follow-up appointment as needed with Dr. Gwenlyn Found.

## 2016-10-18 NOTE — Progress Notes (Signed)
     10/18/2016 Jennifer Singh   Jan 12, 1991  [id number]  Primary Physician Patient, No Pcp Per Primary Cardiologist: Lorretta Harp MD Renae Gloss  HPI: Ms Seedorf is a 26 year old mildly overweight single Caucasian female no children is accompanied by her mother Sharyn Lull today. She is referred by the emergency room after being evaluated on 10/13/16 for collapse at work. She apparently was hypotensive when EMS arrived. She does not drink caffeine. She does have ADHD but is not on Adderall. She has also complained of tachycardia palpitations more frequent and noticeable in recent weeks.   Current Outpatient Prescriptions  Medication Sig Dispense Refill  . ALPRAZolam (XANAX) 0.5 MG tablet Take 0.5 mg by mouth at bedtime as needed for anxiety.    . lamoTRIgine (LAMICTAL) 200 MG tablet Take 1 tablet (200 mg total) by mouth daily. 30 tablet 0   No current facility-administered medications for this visit.     Allergies  Allergen Reactions  . Ciprofloxacin Swelling  . Penicillins Other (See Comments)    unknown    Social History   Social History  . Marital status: Single    Spouse name: N/A  . Number of children: N/A  . Years of education: N/A   Occupational History  . Not on file.   Social History Main Topics  . Smoking status: Current Every Day Smoker    Packs/day: 1.00    Years: 2.00    Types: Cigarettes  . Smokeless tobacco: Never Used  . Alcohol use No     Comment: 0  . Drug use: No  . Sexual activity: Yes     Comment: lesbian   Other Topics Concern  . Not on file   Social History Narrative  . No narrative on file     Review of Systems: General: negative for chills, fever, night sweats or weight changes.  Cardiovascular: negative for chest pain, dyspnea on exertion, edema, orthopnea, palpitations, paroxysmal nocturnal dyspnea or shortness of breath Dermatological: negative for rash Respiratory: negative for cough or wheezing Urologic: negative for  hematuria Abdominal: negative for nausea, vomiting, diarrhea, bright red blood per rectum, melena, or hematemesis Neurologic: negative for visual changes, syncope, or dizziness All other systems reviewed and are otherwise negative except as noted above.    Blood pressure 118/68, pulse 86, height 5\' 2"  (1.575 m), weight 160 lb (72.6 kg).  General appearance: alert and no distress Neck: no adenopathy, no carotid bruit, no JVD, supple, symmetrical, trachea midline and thyroid not enlarged, symmetric, no tenderness/mass/nodules Lungs: clear to auscultation bilaterally Heart: regular rate and rhythm, S1, S2 normal, no murmur, click, rub or gallop Extremities: extremities normal, atraumatic, no cyanosis or edema  EKG not performed today  ASSESSMENT AND PLAN:   Palpitations Wall history of palpitations more frequently recently. She does not drink caffeine. She drinks alcohol occasionally. She is not under a lot of stress. She does have ADHD. She is not on Adderall. I'm going to start function tests and a two-week event monitor.  Collapse She was recently seen in the ER by Dr. Cathleen Fears after collapsing at work. Her workup was unremarkable. She did receive a liter of saline. She was hypotensive when EMS arrived. I suspect this is related to dehydration.      Lorretta Harp MD FACP,FACC,FAHA, [facility] 10/18/2016 5:08 PM

## 2016-11-03 ENCOUNTER — Ambulatory Visit (INDEPENDENT_AMBULATORY_CARE_PROVIDER_SITE_OTHER): Payer: BC Managed Care – PPO

## 2016-11-03 DIAGNOSIS — R55 Syncope and collapse: Secondary | ICD-10-CM

## 2016-11-03 DIAGNOSIS — R002 Palpitations: Secondary | ICD-10-CM | POA: Diagnosis not present

## 2016-12-06 ENCOUNTER — Telehealth: Payer: Self-pay | Admitting: Cardiovascular Disease

## 2016-12-06 NOTE — Telephone Encounter (Signed)
New message    Pt is calling for monitor results.

## 2016-12-08 NOTE — Telephone Encounter (Signed)
lmtcb for results. 

## 2016-12-12 NOTE — Telephone Encounter (Signed)
Results given to pt. Pt verbalized understanding.

## 2017-11-05 ENCOUNTER — Other Ambulatory Visit: Payer: Self-pay

## 2017-11-05 ENCOUNTER — Emergency Department (HOSPITAL_BASED_OUTPATIENT_CLINIC_OR_DEPARTMENT_OTHER)
Admission: EM | Admit: 2017-11-05 | Discharge: 2017-11-05 | Disposition: A | Discharge status: Home / Self Care | Payer: 59 | Attending: Emergency Medicine | Admitting: Emergency Medicine

## 2017-11-05 ENCOUNTER — Encounter (HOSPITAL_BASED_OUTPATIENT_CLINIC_OR_DEPARTMENT_OTHER): Payer: Self-pay | Admitting: Emergency Medicine

## 2017-11-05 ENCOUNTER — Emergency Department (HOSPITAL_BASED_OUTPATIENT_CLINIC_OR_DEPARTMENT_OTHER): Payer: 59

## 2017-11-05 DIAGNOSIS — K529 Noninfective gastroenteritis and colitis, unspecified: Secondary | ICD-10-CM | POA: Insufficient documentation

## 2017-11-05 DIAGNOSIS — Z79899 Other long term (current) drug therapy: Secondary | ICD-10-CM | POA: Insufficient documentation

## 2017-11-05 DIAGNOSIS — F17210 Nicotine dependence, cigarettes, uncomplicated: Secondary | ICD-10-CM | POA: Diagnosis not present

## 2017-11-05 DIAGNOSIS — R1033 Periumbilical pain: Secondary | ICD-10-CM | POA: Diagnosis present

## 2017-11-05 LAB — COMPREHENSIVE METABOLIC PANEL
ALT: 24 U/L (ref 0–44)
AST: 23 U/L (ref 15–41)
Albumin: 3.9 g/dL (ref 3.5–5.0)
Alkaline Phosphatase: 79 U/L (ref 38–126)
Anion gap: 8 (ref 5–15)
BUN: 6 mg/dL (ref 6–20)
CO2: 25 mmol/L (ref 22–32)
Calcium: 8.6 mg/dL — ABNORMAL LOW (ref 8.9–10.3)
Chloride: 103 mmol/L (ref 98–111)
Creatinine, Ser: 0.56 mg/dL (ref 0.44–1.00)
GFR calc Af Amer: >60 mL/min (ref >60)
GFR calc non Af Amer: >60 mL/min (ref >60)
Glucose, Bld: 88 mg/dL (ref 70–99)
Potassium: 3.9 mmol/L (ref 3.5–5.1)
Sodium: 136 mmol/L (ref 135–145)
Total Bilirubin: 0.6 mg/dL (ref 0.3–1.2)
Total Protein: 7.3 g/dL (ref 6.5–8.1)

## 2017-11-05 LAB — CBC
HCT: 37.1 % (ref 36.0–46.0)
Hemoglobin: 13.3 g/dL (ref 12.0–15.0)
MCH: 31 pg (ref 26.0–34.0)
MCHC: 35.8 g/dL (ref 30.0–36.0)
MCV: 86.5 fL (ref 78.0–100.0)
Platelets: 238 10*3/uL (ref 150–400)
RBC: 4.29 MIL/uL (ref 3.87–5.11)
RDW: 12 % (ref 11.5–15.5)
WBC: 8.4 10*3/uL (ref 4.0–10.5)

## 2017-11-05 LAB — URINALYSIS, MICROSCOPIC (REFLEX): WBC, UA: NONE SEEN WBC/hpf (ref 0–5)

## 2017-11-05 LAB — URINALYSIS, ROUTINE W REFLEX MICROSCOPIC
Bilirubin Urine: NEGATIVE
Glucose, UA: NEGATIVE mg/dL
Ketones, ur: NEGATIVE mg/dL
Leukocytes, UA: NEGATIVE
Nitrite: NEGATIVE
Protein, ur: NEGATIVE mg/dL
Specific Gravity, Urine: 1.015 (ref 1.005–1.030)
pH: 7 (ref 5.0–8.0)

## 2017-11-05 LAB — PREGNANCY, URINE: Preg Test, Ur: NEGATIVE

## 2017-11-05 LAB — LIPASE, BLOOD: Lipase: 28 U/L (ref 11–51)

## 2017-11-05 MED ORDER — ONDANSETRON 4 MG PO TBDP: ORAL_TABLET | ORAL | 0 refills | Status: DC

## 2017-11-05 MED ORDER — METRONIDAZOLE 500 MG PO TABS: 500.0000 mg | ORAL_TABLET | Freq: Three times a day (TID) | ORAL | 0 refills | Status: DC

## 2017-11-05 MED ORDER — IOPAMIDOL (ISOVUE-300) INJECTION 61%
100.0000 mL | Freq: Once | INTRAVENOUS | Status: AC | PRN
  Administered 2017-11-05: 100 mL via INTRAVENOUS

## 2017-11-05 NOTE — ED Notes (Signed)
Patient transported to CT 

## 2017-11-05 NOTE — ED Triage Notes (Signed)
Patient states that she is having "severe radiating abdominal pain x 2 days" patient continues to be Nausea after several doses of zofran. Patient states that she is here because she is concerned that she may have appendicitis

## 2017-11-05 NOTE — ED Notes (Signed)
Patient returned from CT

## 2017-11-05 NOTE — ED Provider Notes (Signed)
[facility] Provider Note   CSN: [id number] Arrival date & time: 11/05/17  [place]     History   Chief Complaint Chief Complaint  Patient presents with  . Abdominal Pain    HPI Jennel Mara is a 27 y.o. female.  Patient is a 27 year old female who presents with abdominal pain.  She reports a 2-day history of worsening pain to her periumbilical area.  She had some associated nausea and one episode of vomiting.  The pain waxes and wanes in intensity.  She has had a couple episodes of loose stools.  No known fevers.  No history of similar symptoms in the past.  No prior abdominal surgeries.  She is tried Zofran at home without improvement in symptoms.     Past Medical History:  Diagnosis Date  . ADHD (attention deficit hyperactivity disorder)   . Anxiety   . Central auditory processing disorder (CAPD)   . Dysmenorrhea   . Obsessive-compulsive disorder   . Oppositional defiant disorder     Patient Active Problem List   Diagnosis Date Noted  . Palpitations 10/18/2016  . Collapse 10/18/2016  . Weight gain 12/25/2013  . Left knee pain 05/08/2013  . OCD (obsessive compulsive disorder) 06/08/2011    Past Surgical History:  Procedure Laterality Date  . WISDOM TOOTH EXTRACTION       OB History    Gravida  0   Para      Term      Preterm      AB      Living        SAB      TAB      Ectopic      Multiple      Live Births               Home Medications    Prior to Admission medications   Medication Sig Start Date End Date Taking? Authorizing Provider  ALPRAZolam Duanne Moron) 0.5 MG tablet Take 0.5 mg by mouth at bedtime as needed for anxiety.    [provider]  lamoTRIgine (LAMICTAL) 200 MG tablet Take 1 tablet (200 mg total) by mouth daily. 02/02/16   Merian Capron, MD  metroNIDAZOLE (FLAGYL) 500 MG tablet Take 1 tablet (500 mg total) by mouth 3 (three) times daily. One po TID x 7 days 11/05/17   Malvin Johns, MD  ondansetron (ZOFRAN ODT) 4 MG disintegrating tablet 4mg  ODT q4 hours prn nausea/vomit 11/05/17   Malvin Johns, MD  amphetamine-dextroamphetamine (ADDERALL XR) 5 MG 24 hr capsule Take 1 capsule (5 mg total) by mouth daily. 10/27/15 11/18/15  Merian Capron, MD  ARIPiprazole (ABILIFY) 5 MG tablet Take 1 tablet (5 mg total) by mouth daily. 02/03/15 02/13/15  Merian Capron, MD  gabapentin (NEURONTIN) 300 MG capsule Take 1 capsule (300 mg total) by mouth at bedtime. 11/18/15 02/02/16  Merian Capron, MD  lisdexamfetamine (VYVANSE) 20 MG capsule Take 1 capsule (20 mg total) by mouth every morning. 11/18/15 02/02/16  Merian Capron, MD    Family History Family History  Problem Relation Age of Onset  . ADD / ADHD Mother   . Thyroid disease Brother   . Hypertension Brother   . Hyperlipidemia Brother   . Diabetes Brother   . Hyperlipidemia Father   . Hypertension Father   . Depression Maternal Uncle   . Alcohol abuse Maternal Grandfather   . OCD Maternal Grandmother   . Heart attack Neg Hx  Social History Social History   Tobacco Use  . Smoking status: Current Every Day Smoker    Packs/day: 1.00    Years: 2.00    Pack years: 2.00    Types: Cigarettes  . Smokeless tobacco: Never Used  Substance Use Topics  . Alcohol use: No    Alcohol/week: 3.6 oz    Types: 6 Standard drinks or equivalent per week    Comment: 0  . Drug use: No     Allergies   Ciprofloxacin; Penicillins; and Clindamycin/lincomycin   Review of Systems Review of Systems  Constitutional: Negative for chills, diaphoresis, fatigue and fever.  HENT: Negative for congestion, rhinorrhea and sneezing.   Eyes: Negative.   Respiratory: Negative for cough, chest tightness and shortness of breath.   Cardiovascular: Negative for chest pain and leg swelling.  Gastrointestinal: Positive for abdominal pain, diarrhea, nausea and vomiting. Negative for blood in stool.  Genitourinary: Negative for difficulty  urinating, flank pain, frequency and hematuria.  Musculoskeletal: Negative for arthralgias and back pain.  Skin: Negative for rash.  Neurological: Negative for dizziness, speech difficulty, weakness, numbness and headaches.     Physical Exam Updated Vital Signs BP (!) 142/100 (BP Location: Left Arm)   Pulse 94   Temp 98.4 F (36.9 C) (Oral)   Resp 18   Ht 5\' 4"  (1.626 m)   Wt 70.8 kg (156 lb)   LMP 10/22/2017   SpO2 99%   BMI 26.78 kg/m   Physical Exam  Constitutional: She is oriented to person, place, and time. She appears well-developed and well-nourished.  HENT:  Head: Normocephalic and atraumatic.  Eyes: Pupils are equal, round, and reactive to light.  Neck: Normal range of motion. Neck supple.  Cardiovascular: Normal rate, regular rhythm and normal heart sounds.  Pulmonary/Chest: Effort normal and breath sounds normal. No respiratory distress. She has no wheezes. She has no rales. She exhibits no tenderness.  Abdominal: Soft. Bowel sounds are normal. There is tenderness in the right lower quadrant and periumbilical area. There is no rebound and no guarding.  Musculoskeletal: Normal range of motion. She exhibits no edema.  Lymphadenopathy:    She has no cervical adenopathy.  Neurological: She is alert and oriented to person, place, and time.  Skin: Skin is warm and dry. No rash noted.  Psychiatric: She has a normal mood and affect.     ED Treatments / Results  Labs (all labs ordered are listed, but only abnormal results are displayed) Labs Reviewed  COMPREHENSIVE METABOLIC PANEL - Abnormal; Notable for the following components:      Result Value   Calcium 8.6 (*)    All other components within normal limits  URINALYSIS, ROUTINE W REFLEX MICROSCOPIC - Abnormal; Notable for the following components:   Hgb urine dipstick TRACE (*)    All other components within normal limits  URINALYSIS, MICROSCOPIC (REFLEX) - Abnormal; Notable for the following components:    Bacteria, UA MANY (*)    All other components within normal limits  LIPASE, BLOOD  CBC  PREGNANCY, URINE    EKG None  Radiology Ct Abdomen Pelvis W Contrast  Result Date: 11/05/2017 CLINICAL DATA:  Severe abdominal pain. EXAM: CT ABDOMEN AND PELVIS WITH CONTRAST TECHNIQUE: Multidetector CT imaging of the abdomen and pelvis was performed using the standard protocol following bolus administration of intravenous contrast. CONTRAST:  141mL ISOVUE-300 IOPAMIDOL (ISOVUE-300) INJECTION 61% COMPARISON:  None. FINDINGS: Lower chest: No acute abnormality. Hepatobiliary: No focal liver abnormality is seen. No gallstones, gallbladder  wall thickening, or biliary dilatation. Pancreas: Unremarkable. No pancreatic ductal dilatation or surrounding inflammatory changes. Spleen: Normal in size without focal abnormality. Adrenals/Urinary Tract: Adrenal glands are unremarkable. Kidneys are normal, without renal calculi, focal lesion, or hydronephrosis. Bladder is unremarkable. Stomach/Bowel: The stomach is within normal limits. Moderate to severe wall thickening with mild surrounding inflammatory stranding involving the majority of the colon, sparing the cecum and distal rectosigmoid colon. The small bowel is unremarkable. No obstruction. Normal appendix. Vascular/Lymphatic: No significant vascular findings are present. No enlarged abdominal or pelvic lymph nodes. Reproductive: Uterus and bilateral adnexa are unremarkable. Other: Trace free fluid in the pelvis.  No pneumoperitoneum Musculoskeletal: No acute or significant osseous findings. IMPRESSION: 1. Findings consistent with acute colitis involving the majority of the colon, sparing the cecum and distal rectosigmoid colon. 2. Normal appendix. Electronically Signed   By: Titus Dubin M.D.   On: 11/05/2017 20:17    Procedures Procedures (including critical care time)  Medications Ordered in ED Medications  iopamidol (ISOVUE-300) 61 % injection 100 mL (100 mLs  Intravenous Contrast Given 11/05/17 1937)     Initial Impression / Assessment and Plan / ED Course  I have reviewed the triage vital signs and the nursing notes.  Pertinent labs & imaging results that were available during my care of the patient were reviewed by me and considered in my medical decision making (see chart for details).     Patient is a 28 year old female who presents with abdominal pain.  She has had some associated nausea with one episode of vomiting and couple episodes of loose stools but no persistent diarrhea.  Her abdominal exam showed some tenderness in the periumbilical and right lower quadrant.  For this reason a CT scan was performed.  This shows normal appendix.  However she does have evidence of colitis.  She was started on Flagyl.  She has allergies to Cipro, penicillins and clindamycin so at this point I will just leave her on the Flagyl alone.  It is possible this could also be a viral illness.  She was given a referral to follow-up with GI if her symptoms are not improving.  Her labs are non-concerning.  She was discharged home in good condition.  She was advised to use a clear liquid diet for the next 24 hours and slowly progress after that.  Return precautions were given.  Final Clinical Impressions(s) / ED Diagnoses   Final diagnoses:  Colitis    ED Discharge Orders        Ordered    metroNIDAZOLE (FLAGYL) 500 MG tablet  3 times daily     11/05/17 2041    ondansetron (ZOFRAN ODT) 4 MG disintegrating tablet     11/05/17 2041       Malvin Johns, MD 11/05/17 2044

## 2017-11-05 NOTE — ED Notes (Signed)
Pt given d/c instructions as per chart. Rx x 2. Verbalizes understanding. No questions. 

## 2017-11-05 NOTE — ED Notes (Signed)
ED Provider at bedside. 

## 2019-04-16 IMAGING — CT CT ABD-PELV W/ CM
2 of 4 series · 16 of 46 positions shown, 18 images · IV contrast (APPLIED)
Comparison: None.

CLINICAL DATA: Severe abdominal pain.

EXAM:
CT ABDOMEN AND PELVIS WITH CONTRAST
TECHNIQUE: Multidetector CT imaging of the abdomen and pelvis was performed
using the standard protocol following bolus administration of
intravenous contrast.
CONTRAST:  100mL DTZQWR-499 IOPAMIDOL (DTZQWR-499) INJECTION 61%

[Series 2: axial st · axial · 0.75mm/px · z∈[-467,-7]mm · 13 of 100 slices shown, 15 images]
[im 4/100  soft-tissue]
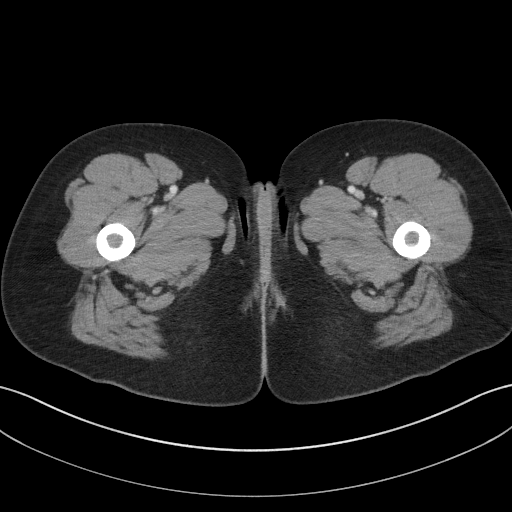
[im 4/100  bone]
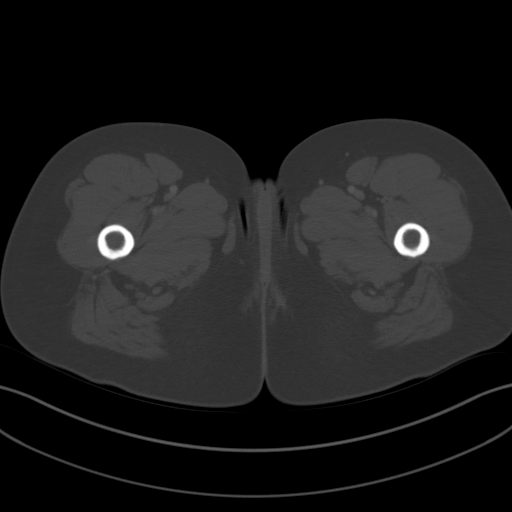
[im 12/100  soft-tissue]
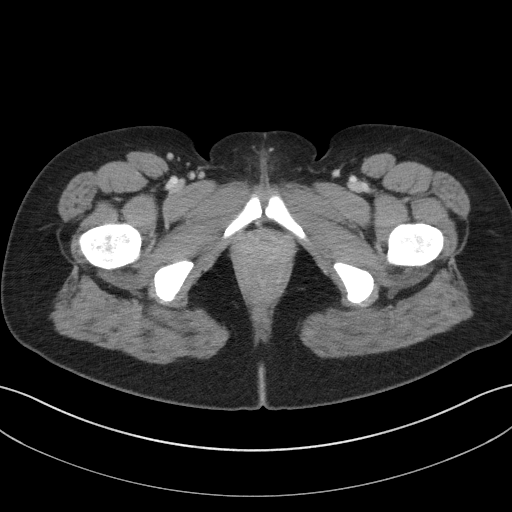
[im 20/100  soft-tissue]
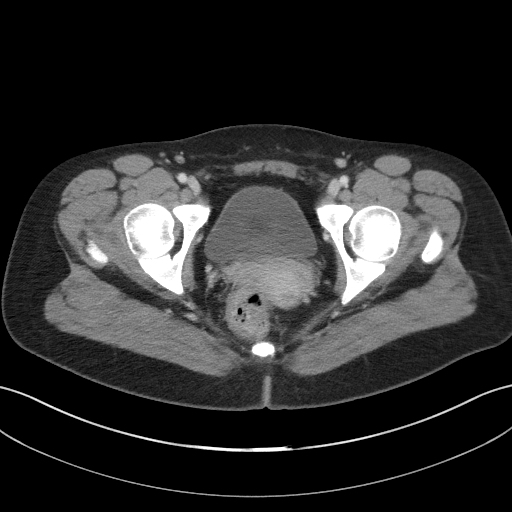
[im 27/100  soft-tissue]
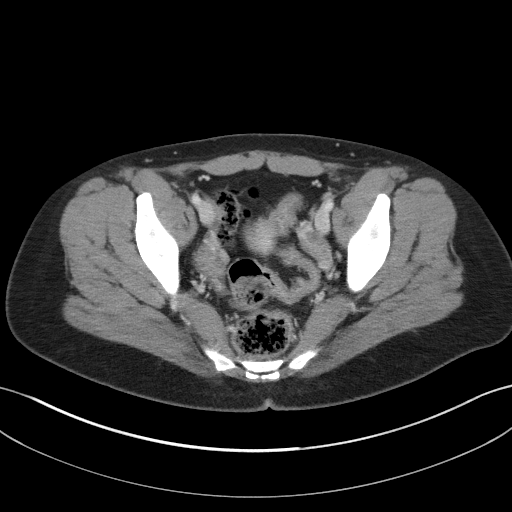
[im 35/100  soft-tissue]
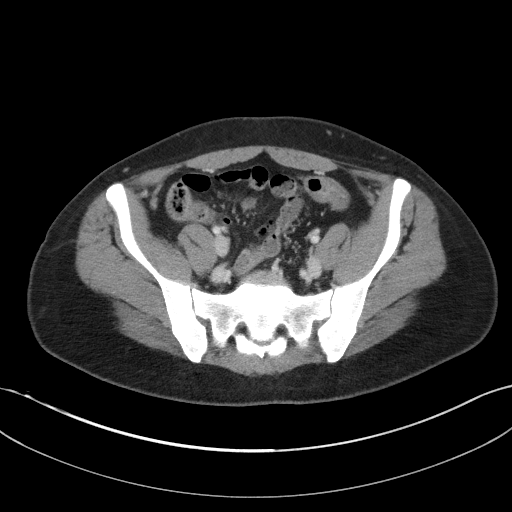
[im 42/100  soft-tissue]
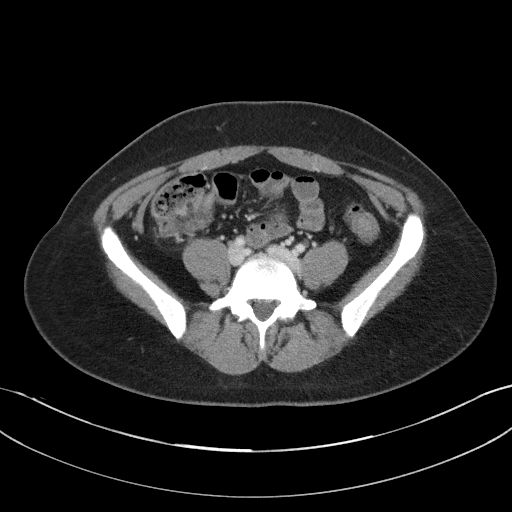
[im 50/100  soft-tissue]
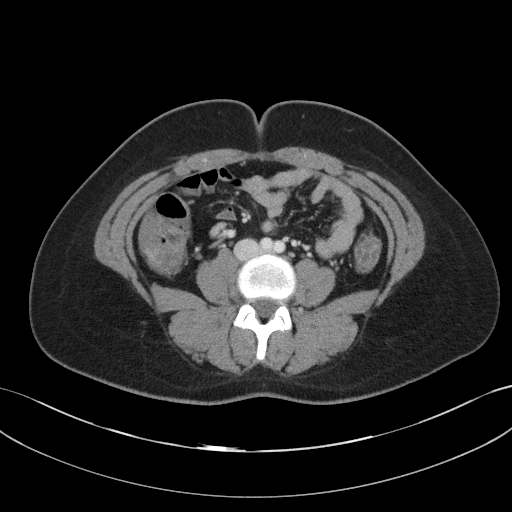
[im 58/100  soft-tissue]
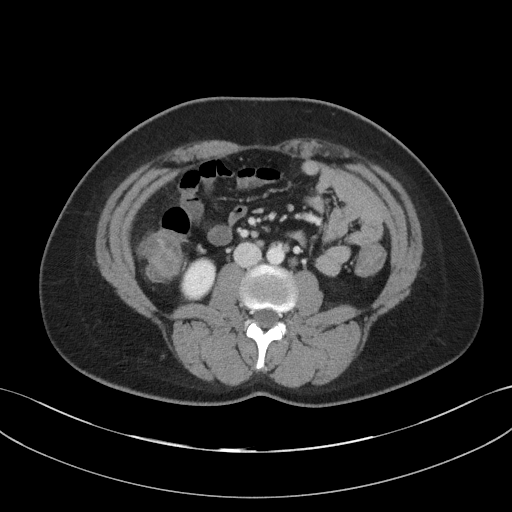
[im 65/100  soft-tissue]
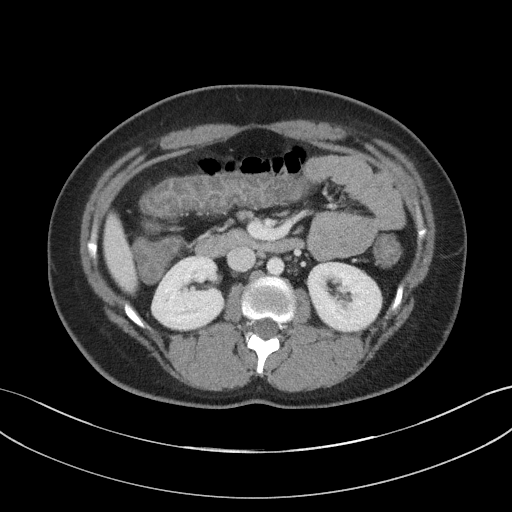
[im 65/100  bone]
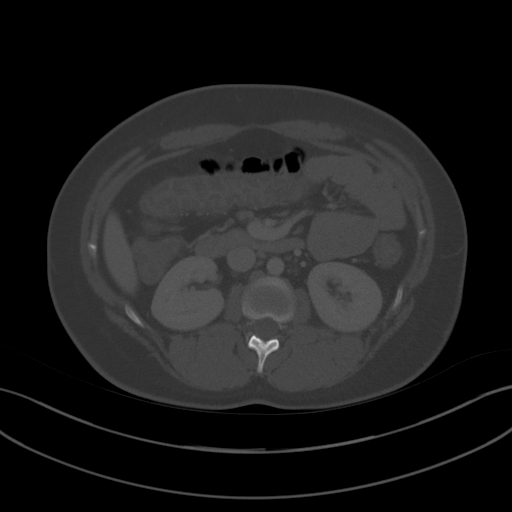
[im 73/100  soft-tissue]
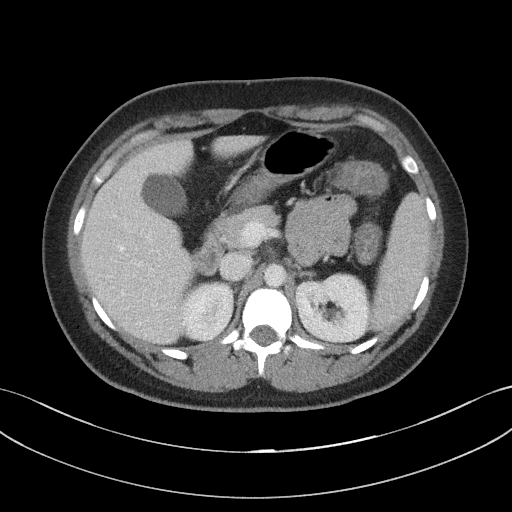
[im 80/100  soft-tissue]
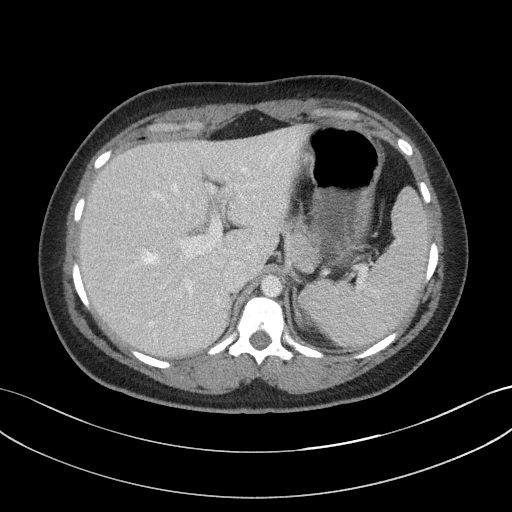
[im 88/100  soft-tissue]
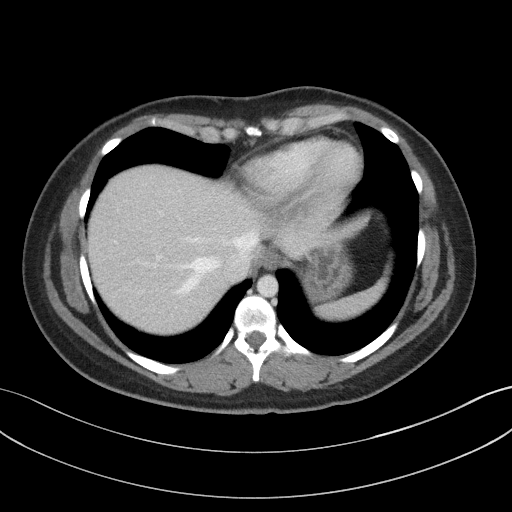
[im 96/100  soft-tissue]
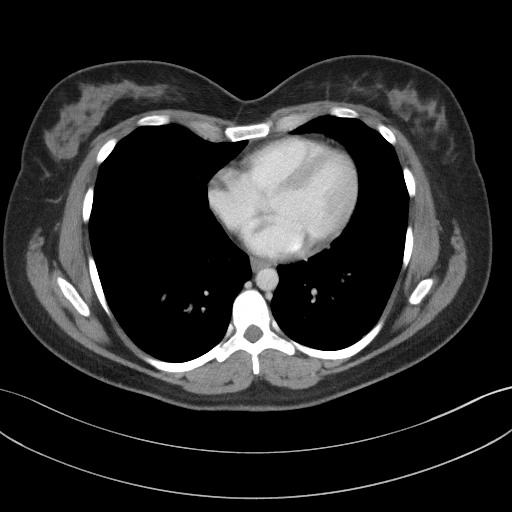

[Series 5: coronal st · coronal · 0.83mm/px · 3 of 82 slices shown]
[im 28/82  soft-tissue]
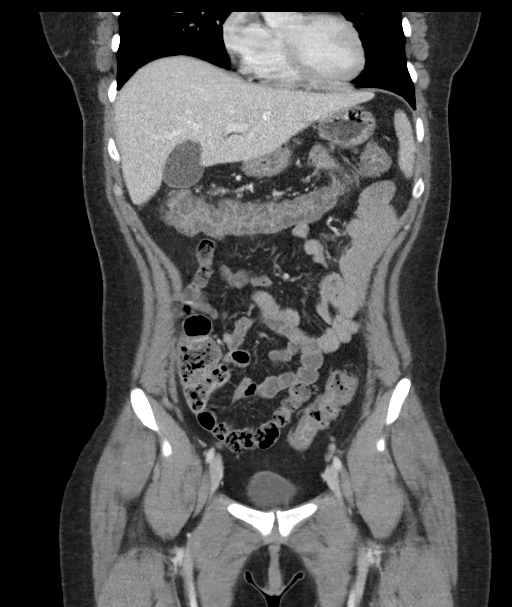
[im 37/82  soft-tissue]
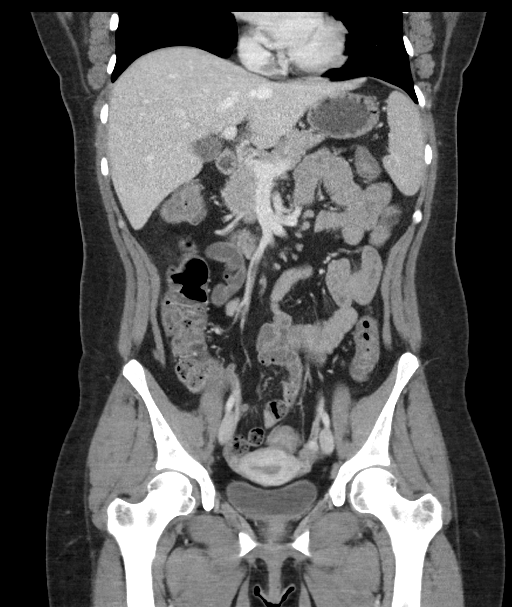
[im 46/82  soft-tissue]
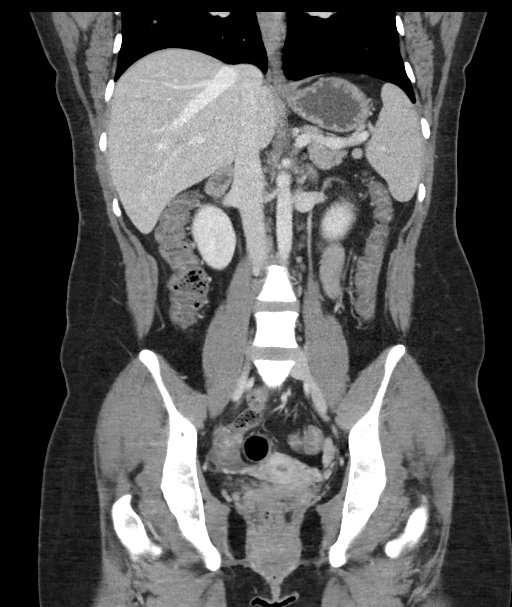

[16 of 46 positions shown; findings below may reference images not displayed]

FINDINGS: Lower chest: No acute abnormality.

Hepatobiliary: No focal liver abnormality is seen. No gallstones,
gallbladder wall thickening, or biliary dilatation.

Pancreas: Unremarkable. No pancreatic ductal dilatation or
surrounding inflammatory changes.

Spleen: Normal in size without focal abnormality.

Adrenals/Urinary Tract: Adrenal glands are unremarkable. Kidneys are
normal, without renal calculi, focal lesion, or hydronephrosis.
Bladder is unremarkable.

Stomach/Bowel: The stomach is within normal limits. Moderate to
severe wall thickening with mild surrounding inflammatory stranding
involving the majority of the colon, sparing the cecum and distal
rectosigmoid colon. The small bowel is unremarkable. No obstruction.
Normal appendix.

Vascular/Lymphatic: No significant vascular findings are present. No
enlarged abdominal or pelvic lymph nodes.

Reproductive: Uterus and bilateral adnexa are unremarkable.

Other: Trace free fluid in the pelvis.  No pneumoperitoneum

Musculoskeletal: No acute or significant osseous findings.
IMPRESSION: 1. Findings consistent with acute colitis involving the majority of
the colon, sparing the cecum and distal rectosigmoid colon.
2. Normal appendix.

## 2019-12-25 ENCOUNTER — Ambulatory Visit (INDEPENDENT_AMBULATORY_CARE_PROVIDER_SITE_OTHER): Payer: 59 | Admitting: Allergy and Immunology

## 2019-12-25 ENCOUNTER — Encounter: Payer: Self-pay | Admitting: Allergy and Immunology

## 2019-12-25 ENCOUNTER — Other Ambulatory Visit: Payer: Self-pay

## 2019-12-25 VITALS — BP 110/82 | HR 96 | Resp 18 | Ht 61.2 in | Wt 170.2 lb

## 2019-12-25 DIAGNOSIS — H1010 Acute atopic conjunctivitis, unspecified eye: Secondary | ICD-10-CM

## 2019-12-25 DIAGNOSIS — J301 Allergic rhinitis due to pollen: Secondary | ICD-10-CM | POA: Diagnosis not present

## 2019-12-25 DIAGNOSIS — L500 Allergic urticaria: Secondary | ICD-10-CM | POA: Diagnosis not present

## 2019-12-25 DIAGNOSIS — J3089 Other allergic rhinitis: Secondary | ICD-10-CM | POA: Diagnosis not present

## 2019-12-25 DIAGNOSIS — D4701 Cutaneous mastocytosis: Secondary | ICD-10-CM

## 2019-12-25 DIAGNOSIS — L719 Rosacea, unspecified: Secondary | ICD-10-CM

## 2019-12-25 MED ORDER — MONTELUKAST SODIUM 10 MG PO TABS
10.0000 mg | ORAL_TABLET | Freq: Every day | ORAL | 5 refills | Status: DC
Start: 2019-12-25 — End: 2020-06-29

## 2019-12-25 MED ORDER — OLOPATADINE HCL 0.2 % OP SOLN: OPHTHALMIC | 5 refills | Status: DC

## 2019-12-25 MED ORDER — METRONIDAZOLE 0.75 % EX CREA: TOPICAL_CREAM | CUTANEOUS | 5 refills | Status: DC

## 2019-12-25 NOTE — Patient Instructions (Addendum)
  1.  Allergen avoidance measures - pollen, dust mite  2.  Treat and prevent inflammation:   A. OTC Nasacort / Rhinocort - 1-2 sprays each nostril 1 time per day  B. Montelukast 10 mg - 1 tablet 1 time per day  3.  Treat and prevent rosacea:   A. metrocream applied to face 2 times per day  4.  If needed:   A. OTC Cetrizine 10 mg - 1-2 tablets 1-2 times per day (MAX=40mg /day)  C. OTC Pataday - 1 drop each eye 1 time per day  5.  Blood - Tryptase, CBC w/diff. Area 2 aeroallergen profile  6.  Consider a course of immunotherapy  7.  Plan for fall flu vaccine and Covid booster  8.  Return to clinic in 3 weeks or earlier if problem

## 2019-12-25 NOTE — Progress Notes (Signed)
Jennifer - Singh   NEW PATIENT NOTE  Referring Provider: No ref. provider found Primary Provider: Patient, No Pcp Per Date of office visit: 12/25/2019    Subjective:   Chief Complaint:  Jennifer Singh (DOB: [birth year]) is a 29 y.o. female who presents to the clinic on 12/25/2019 with a chief complaint of Allergic Rhinitis  and Urticaria .  HPI: [name] presents to this clinic in evaluation of allergic disease.  She has a multiyear history of having seasonal allergic rhinitis that presents itself during the spring and fall season following exposure to pollen.  She has noticed over the course of the past several years that her issues have progressed and now when she has exposure to pollen and especially exposure to cat she gets uncontrollable sneezing and itchy red watery eyes and coughing without any shortness of breath or chest tightness or wheezing.  This occurs even though she has consistently used multiple antihistamines.  As well, 6 months ago she developed an episode of hives that were manifested as red raised itchy lesions requiring administration of prednisone and 1 month ago she developed hives on her face and neck that were burning and she definitely developed "welts" in this area as well.  She was treated with prednisone on 2 occasions for this event and she does respond to the administration of prednisone although some of the skin lesions have never completely resolved on her face.  In addition to receiving 3 courses of systemic steroids for her skin condition over the course of the past 6 months she required another systemic steroid for a "sinus infection" in April 2021.  Past Medical History:  Diagnosis Date  . ADHD (attention deficit hyperactivity disorder)   . Anxiety   . Central auditory processing disorder (CAPD)   . Colitis   . Dysmenorrhea   . Obsessive-compulsive disorder   . Oppositional defiant disorder   . Syncopal  episodes     Past Surgical History:  Procedure Laterality Date  . WISDOM TOOTH EXTRACTION      Allergies as of 12/25/2019      Reactions   Ciprofloxacin Swelling   Penicillins Other (See Comments)   unknown   Clindamycin/lincomycin Rash      Medication List    BENADRYL ALLERGY PO Take by mouth as needed.   cetirizine 10 MG tablet Commonly known as: ZYRTEC Take 10 mg by mouth daily.   Concerta 36 MG CR tablet Generic drug: methylphenidate Take 36 mg by mouth every morning.   lamoTRIgine 150 MG tablet Commonly known as: LAMICTAL Take 150 mg by mouth daily.    PEPCID PO Take by mouth as needed.       Review of systems negative except as noted in HPI / PMHx or noted below:  Review of Systems  Constitutional: Negative.   HENT: Negative.   Eyes: Negative.   Respiratory: Negative.   Cardiovascular: Negative.   Gastrointestinal: Negative.   Genitourinary: Negative.   Musculoskeletal: Negative.   Skin: Negative.   Neurological: Negative.   Endo/Heme/Allergies: Negative.   Psychiatric/Behavioral: Negative.     Family History  Problem Relation Age of Onset  . ADD / ADHD Mother   . Thyroid disease Brother   . Hypertension Brother   . Hyperlipidemia Brother   . Diabetes Brother   . Hyperlipidemia Father   . Hypertension Father   . Bladder Cancer Father   . Thyroid disease Father   . Depression Maternal  Uncle   . Alcohol abuse Maternal Grandfather   . Liver cancer Maternal Grandfather   . Hypertension Maternal Grandfather   . Hyperlipidemia Maternal Grandfather   . OCD Maternal Grandmother   . Heart attack Maternal Grandmother   . Heart Problems Maternal Grandmother     Social History   Socioeconomic History  . Marital status: Single    Spouse name: Not on file  . Number of children: Not on file  . Years of education: Not on file  . Highest education level: Not on file  Occupational History  . Not on file  Tobacco Use  . Smoking status: Former  Smoker    Years: 10.00    Types: Cigarettes  . Smokeless tobacco: Never Used  . Tobacco comment: smoked on and off from age 8 to 70  Substance and Sexual Activity  . Alcohol use: Yes    Alcohol/week: 6.0 standard drinks    Types: 6 Standard drinks or equivalent per week    Comment: 0  . Drug use: No  . Sexual activity: Yes    Comment: lesbian  Other Topics Concern  . Not on file  Social History Narrative  . Not on file    Environmental and Social history  Lives in a apartment with a dry environment, cats and dogs located inside the household, no carpet in the bedroom, no plastic on the bed, no plastic on the pillow, no smoking ongoing with inside the household.  She works as Public relations account executive.  Objective:   Vitals:   12/25/19 0908  BP: 110/82  Pulse: 96  Resp: 18  SpO2: 98%   Height: 5' 1.2" (155.4 cm) Weight: 170 lb 3.2 oz (77.2 kg)  Physical Exam Constitutional:      Appearance: She is not diaphoretic.  HENT:     Head: Normocephalic.     Right Ear: Tympanic membrane, ear canal and external ear normal.     Left Ear: Tympanic membrane, ear canal and external ear normal.     Nose: Nose normal. No mucosal edema or rhinorrhea.     Mouth/Throat:     Pharynx: Uvula midline. No oropharyngeal exudate.  Eyes:     Conjunctiva/sclera: Conjunctivae normal.  Neck:     Thyroid: No thyromegaly.     Trachea: Trachea normal. No tracheal tenderness or tracheal deviation.  Cardiovascular:     Rate and Rhythm: Normal rate and regular rhythm.     Heart sounds: Normal heart sounds, S1 normal and S2 normal. No murmur heard.   Pulmonary:     Effort: No respiratory distress.     Breath sounds: Normal breath sounds. No stridor. No wheezing or rales.  Lymphadenopathy:     Head:     Right side of head: No tonsillar adenopathy.     Left side of head: No tonsillar adenopathy.     Cervical: No cervical adenopathy.  Skin:    Findings: No erythema or rash (Multiple hyperpigmented macules across  arms and shoulders and face.  Approximately 6 erythematous papule-like lesions face.  Negative Darier sign. Sunburn. ).     Nails: There is no clubbing.  Neurological:     Mental Status: She is alert.     Diagnostics: Allergy skin tests were performed.  She demonstrated hypersensitivity to tree pollen and dust mite.  Assessment and Plan:    1. Perennial allergic rhinitis   2. Seasonal allergic rhinitis due to pollen   3. Seasonal allergic conjunctivitis   4. Allergic urticaria  5. Rosacea   6. Urticaria pigmentosa     1.  Allergen avoidance measures - pollen, dust mite  2.  Treat and prevent inflammation:   A. OTC Nasacort / Rhinocort - 1-2 sprays each nostril 1 time per day  B. Montelukast 10 mg - 1 tablet 1 time per day  3.  Treat and prevent rosacea:   A. metrocream applied to face 2 times per day  4.  If needed:   A. OTC Cetrizine 10 mg - 1-2 tablets 1-2 times per day (MAX=40mg /day)  C. OTC Pataday - 1 drop each eye 1 time per day  5.  Blood - Tryptase, CBC w/diff. Area 2 aeroallergen profile  6.  Consider a course of immunotherapy  7.  Plan for fall flu vaccine and Covid booster  8.  Return to clinic in 3 weeks or earlier if problem  Nakeya appears to have some degree of atopic disease giving rise to issues involving her airway and eyes and skin and we will treat her with a combination of allergen avoidance measures and the use of anti-inflammatory medications utilized on a consistent basis.  Her history is somewhat discordant with her skin test results and will follow up her aero allergen hypersensitivity by checking an area two aero allergen profile.  As well, she seems very atopic and she has significant hyperpigmented macules across her body and we need to make sure that we are not dealing with an overexpansion of cutaneous mast cells and we will screen her blood for urticaria pigmentosa / mastocytosis with tryptase level.  She would definitely be a candidate  for immunotherapy and we have given her literature on this form of treatment during today's visit.  I will see her back in his clinic in 3 weeks or earlier if there is a problem.  Allena Katz, MD Allergy / Immunology [place]

## 2019-12-26 ENCOUNTER — Encounter: Payer: Self-pay | Admitting: Allergy and Immunology

## 2019-12-27 LAB — TRYPTASE: Tryptase: 3.2 ug/L (ref 2.2–13.2)

## 2019-12-29 LAB — ALLERGENS W/TOTAL IGE AREA 2
Alternaria Alternata IgE: <0.1 kU/L
Aspergillus Fumigatus IgE: <0.1 kU/L
Bermuda Grass IgE: <0.1 kU/L
Cat Dander IgE: <0.1 kU/L
Cedar, Mountain IgE: <0.1 kU/L
Cladosporium Herbarum IgE: <0.1 kU/L
Cockroach, German IgE: 0.21 kU/L — AB
Common Silver Birch IgE: <0.1 kU/L
Cottonwood IgE: <0.1 kU/L
D Farinae IgE: <0.1 kU/L
D Pteronyssinus IgE: <0.1 kU/L
Dog Dander IgE: <0.1 kU/L
Elm, American IgE: <0.1 kU/L
IgE (Immunoglobulin E), Serum: 83 IU/mL (ref 6–495)
Johnson Grass IgE: <0.1 kU/L
Maple/Box Elder IgE: <0.1 kU/L
Mouse Urine IgE: <0.1 kU/L
Oak, White IgE: <0.1 kU/L
Pecan, Hickory IgE: <0.1 kU/L
Penicillium Chrysogen IgE: <0.1 kU/L
Pigweed, Rough IgE: <0.1 kU/L
Ragweed, Short IgE: <0.1 kU/L
Sheep Sorrel IgE Qn: <0.1 kU/L
Timothy Grass IgE: <0.1 kU/L
White Mulberry IgE: <0.1 kU/L

## 2019-12-29 LAB — CBC WITH DIFFERENTIAL/PLATELET
Basophils Absolute: 0 10*3/uL (ref 0.0–0.2)
Basos: 1 %
EOS (ABSOLUTE): 0.1 10*3/uL (ref 0.0–0.4)
Eos: 2 %
Hematocrit: 39.6 % (ref 34.0–46.6)
Hemoglobin: 13.6 g/dL (ref 11.1–15.9)
Immature Grans (Abs): 0 10*3/uL (ref 0.0–0.1)
Immature Granulocytes: 0 %
Lymphocytes Absolute: 2.2 10*3/uL (ref 0.7–3.1)
Lymphs: 36 %
MCH: 30.6 pg (ref 26.6–33.0)
MCHC: 34.3 g/dL (ref 31.5–35.7)
MCV: 89 fL (ref 79–97)
Monocytes Absolute: 0.5 10*3/uL (ref 0.1–0.9)
Monocytes: 8 %
Neutrophils Absolute: 3.1 10*3/uL (ref 1.4–7.0)
Neutrophils: 53 %
Platelets: 279 10*3/uL (ref 150–450)
RBC: 4.44 x10E6/uL (ref 3.77–5.28)
RDW: 12.1 % (ref 11.7–15.4)
WBC: 5.9 10*3/uL (ref 3.4–10.8)

## 2020-01-23 ENCOUNTER — Ambulatory Visit: Payer: 59 | Admitting: Allergy and Immunology

## 2020-02-03 ENCOUNTER — Telehealth: Payer: Self-pay | Admitting: Allergy and Immunology

## 2020-02-03 MED ORDER — PREDNISONE 10 MG PO TABS
ORAL_TABLET | ORAL | 0 refills | Status: DC
Start: 2020-02-03 — End: 2020-07-15

## 2020-02-03 NOTE — Telephone Encounter (Signed)
Ashyra called in and states the MetroCream is working on her face but she is currently broken out in bumps all over her face.  Pretty states she was outside all weekend and feels she was exposed to ragweed.  Keegan is requesting a steroid to be called in.  Please advise.

## 2020-02-03 NOTE — Telephone Encounter (Signed)
Patient informed and [place] sent to [facility].

## 2020-02-03 NOTE — Telephone Encounter (Signed)
Prednisone 10 mg - 1 tablet 1 time per day for 5 days only

## 2020-06-27 ENCOUNTER — Other Ambulatory Visit: Payer: Self-pay | Admitting: Allergy and Immunology

## 2020-07-15 ENCOUNTER — Telehealth: Payer: Self-pay

## 2020-07-15 ENCOUNTER — Other Ambulatory Visit: Payer: Self-pay

## 2020-07-15 MED ORDER — PREDNISONE 10 MG PO TABS: 10.0000 mg | ORAL_TABLET | Freq: Every day | ORAL | 0 refills | Status: AC

## 2020-07-15 NOTE — Telephone Encounter (Signed)
Prednisone has been sent to Jennifer Singh at [facility]. Patient was notified and verbalized understanding.

## 2020-07-15 NOTE — Telephone Encounter (Signed)
Please inform Jennifer Singh that she can use prednisone 10 mg - 1 tablet 1 time per day for 10 days only

## 2020-07-15 NOTE — Telephone Encounter (Signed)
Patient thinks she might have come into contact with something that has caused welts on her face.  They have been present for about an hour.  She has taken Zyrtec 20mg  and Benadryl 50mg  today, and does take Montelukast daily at night as prescribed.  She does use the Metrocream as needed and recently had medication addition of Prozac.  The redness of welts has decreased, but they are still present.  Patient is going out of town tomorrow and is wanting to know if she can have some prednisone called in, just in case the welts do not go away. Please advise.

## 2020-07-31 ENCOUNTER — Other Ambulatory Visit: Payer: Self-pay | Admitting: Allergy and Immunology

## 2020-08-11 ENCOUNTER — Other Ambulatory Visit: Payer: Self-pay | Admitting: Allergy and Immunology

## 2020-12-14 ENCOUNTER — Other Ambulatory Visit: Payer: Self-pay | Admitting: Allergy and Immunology

## 2021-03-08 ENCOUNTER — Ambulatory Visit: Payer: 59 | Admitting: Allergy and Immunology

## 2021-03-08 DIAGNOSIS — J309 Allergic rhinitis, unspecified: Secondary | ICD-10-CM

## 2021-03-12 ENCOUNTER — Ambulatory Visit: Payer: 59 | Admitting: Medical-Surgical

## 2021-06-30 ENCOUNTER — Ambulatory Visit
Admission: EM | Admit: 2021-06-30 | Discharge: 2021-06-30 | Disposition: A | Discharge status: Home / Self Care | Payer: 59

## 2021-06-30 ENCOUNTER — Other Ambulatory Visit: Payer: Self-pay

## 2021-06-30 DIAGNOSIS — H9201 Otalgia, right ear: Secondary | ICD-10-CM

## 2021-06-30 DIAGNOSIS — H8110 Benign paroxysmal vertigo, unspecified ear: Secondary | ICD-10-CM

## 2021-06-30 MED ORDER — MECLIZINE HCL 25 MG PO TABS: 25.0000 mg | ORAL_TABLET | Freq: Three times a day (TID) | ORAL | 0 refills | Status: DC | PRN

## 2021-06-30 MED ORDER — PREDNISONE 20 MG PO TABS: 40.0000 mg | ORAL_TABLET | Freq: Every day | ORAL | 0 refills | Status: AC

## 2021-06-30 MED ORDER — AMOXICILLIN-POT CLAVULANATE 875-125 MG PO TABS: 1.0000 | ORAL_TABLET | Freq: Two times a day (BID) | ORAL | 0 refills | Status: DC

## 2021-06-30 NOTE — ED Provider Notes (Signed)
[facility]    CSN: [id number] Arrival date & time: 06/30/21  1741      History   Chief Complaint Chief Complaint  Patient presents with   Otalgia    right   Dizziness    HPI Jennifer Singh is a 31 y.o. female.   Patient here today for evaluation of right ear fullness that started a few days ago.  She reports that she was treated with amoxicillin and Mucinex by [facility] provider without improvement.  She has now started to have some dizziness and states that she feels as if gravity is pushing her back and has been off balance today.  She has not had any syncopal episodes.  Blood pressure has been normal.  She has not had fever.  She reports that she has minimal symptoms in her left ear and primarily has more right ear fullness.  She does have history of sinus issues.  The history is provided by the patient.  Otalgia Associated symptoms: congestion   Associated symptoms: no abdominal pain, no cough, no diarrhea, no fever, no sore throat and no vomiting   Dizziness Associated symptoms: no diarrhea, no nausea, no shortness of breath and no vomiting    Past Medical History:  Diagnosis Date   ADHD (attention deficit hyperactivity disorder)    Anxiety    Central auditory processing disorder (CAPD)    Colitis    Dysmenorrhea    Obsessive-compulsive disorder    Oppositional defiant disorder    Syncopal episodes     Patient Active Problem List   Diagnosis Date Noted   Palpitations 10/18/2016   Collapse 10/18/2016   Weight gain 12/25/2013   Left knee pain 05/08/2013   OCD (obsessive compulsive disorder) 06/08/2011    Past Surgical History:  Procedure Laterality Date   WISDOM TOOTH EXTRACTION      OB History     Gravida  0   Para      Term      Preterm      AB      Living         SAB      IAB      Ectopic      Multiple      Live Births               Home Medications    Prior to Admission medications   Medication Sig Start  Date End Date Taking? Authorizing Provider  amoxicillin-clavulanate (AUGMENTIN) 875-125 MG tablet Take 1 tablet by mouth every 12 (twelve) hours. 06/30/21  Yes Francene Finders, PA-C  FLUoxetine (PROZAC) 20 MG capsule  08/24/20  Yes [provider]  meclizine (ANTIVERT) 25 MG tablet Take 1 tablet (25 mg total) by mouth 3 (three) times daily as needed for dizziness. 06/30/21  Yes Francene Finders, PA-C  predniSONE (DELTASONE) 20 MG tablet Take 2 tablets (40 mg total) by mouth daily with breakfast for 5 days. 06/30/21 07/05/21 Yes Francene Finders, PA-C  ALPRAZolam Duanne Moron) 0.5 MG tablet Take 0.5 mg by mouth daily as needed. 05/20/21   [provider]  cetirizine (ZYRTEC) 10 MG tablet Take 10 mg by mouth daily.    [provider]  CONCERTA 36 MG CR tablet Take 36 mg by mouth every morning. 11/20/19   [provider]  diphenhydrAMINE HCl (BENADRYL ALLERGY PO) Take by mouth as needed.    [provider]  Famotidine (PEPCID PO) Take by mouth as needed.  [provider]  lamoTRIgine (LAMICTAL) 150 MG tablet Take 150 mg by mouth daily. 12/22/19   [provider]  lithium carbonate 150 MG capsule Take 150 mg by mouth daily. 06/09/21   [provider]  metroNIDAZOLE (METROCREAM) 0.75 % cream Apply to face twice daily as directed. 12/25/19   Kozlow, Donnamarie Poag, MD  montelukast (SINGULAIR) 10 MG tablet Take 1 tablet (10 mg total) by mouth at bedtime. 08/11/20   Kozlow, Donnamarie Poag, MD  Olopatadine HCl 0.2 % SOLN Can use one drop in each eye once daily if needed. 12/25/19   Kozlow, Donnamarie Poag, MD  traZODone (DESYREL) 50 MG tablet Take 50 mg by mouth at bedtime. 06/09/21   [provider]  amphetamine-dextroamphetamine (ADDERALL XR) 5 MG 24 hr capsule Take 1 capsule (5 mg total) by mouth daily. 10/27/15 11/18/15  Merian Capron, MD  ARIPiprazole (ABILIFY) 5 MG tablet Take 1 tablet (5 mg total) by mouth daily. 02/03/15 02/13/15  Merian Capron, MD  gabapentin  (NEURONTIN) 300 MG capsule Take 1 capsule (300 mg total) by mouth at bedtime. 11/18/15 02/02/16  Merian Capron, MD  lisdexamfetamine (VYVANSE) 20 MG capsule Take 1 capsule (20 mg total) by mouth every morning. 11/18/15 02/02/16  Merian Capron, MD    Family History Family History  Problem Relation Age of Onset   ADD / ADHD Mother    Thyroid disease Brother    Hypertension Brother    Hyperlipidemia Brother    Diabetes Brother    Hyperlipidemia Father    Hypertension Father    Bladder Cancer Father    Thyroid disease Father    Depression Maternal Uncle    Alcohol abuse Maternal Grandfather    Liver cancer Maternal Grandfather    Hypertension Maternal Grandfather    Hyperlipidemia Maternal Grandfather    OCD Maternal Grandmother    Heart attack Maternal Grandmother    Heart Problems Maternal Grandmother     Social History Social History   Tobacco Use   Smoking status: Former    Years: 10.00    Types: Cigarettes   Smokeless tobacco: Never   Tobacco comments:    smoked on and off from age 89 to 30  Substance Use Topics   Alcohol use: Yes    Alcohol/week: 6.0 standard drinks    Types: 6 Standard drinks or equivalent per week    Comment: 0   Drug use: No     Allergies   Ciprofloxacin, Penicillins, and Clindamycin/lincomycin   Review of Systems Review of Systems  Constitutional:  Negative for chills and fever.  HENT:  Positive for congestion, ear pain and sinus pressure. Negative for sore throat.   Eyes:  Negative for discharge and redness.  Respiratory:  Negative for cough, shortness of breath and wheezing.   Gastrointestinal:  Negative for abdominal pain, diarrhea, nausea and vomiting.  Neurological:  Positive for dizziness.    Physical Exam Triage Vital Signs ED Triage Vitals  Enc Vitals Group     BP 06/30/21 1823 120/77     Pulse Rate 06/30/21 1823 96     Resp 06/30/21 1823 18     Temp 06/30/21 1823 98.9 F (37.2 C)     Temp Source 06/30/21 1823 Oral      SpO2 06/30/21 1823 97 %     Weight --      Height --      Head Circumference --      Peak Flow --      Pain Score  06/30/21 1827 0     Pain Loc --      Pain Edu? --      Excl. in [place]? --    No data found.  Updated Vital Signs BP 120/77 (BP Location: Left Arm)    Pulse 96    Temp 98.9 F (37.2 C) (Oral)    Resp 18    SpO2 97%    Physical Exam Vitals and nursing note reviewed.  Constitutional:      General: She is not in acute distress.    Appearance: Normal appearance. She is not ill-appearing.  HENT:     Head: Normocephalic and atraumatic.     Left Ear: Tympanic membrane normal.     Ears:     Comments: Right TM dull and bulging    Nose: Congestion present.     Mouth/Throat:     Mouth: Mucous membranes are moist.     Pharynx: No oropharyngeal exudate or posterior oropharyngeal erythema.  Eyes:     Conjunctiva/sclera: Conjunctivae normal.  Cardiovascular:     Rate and Rhythm: Normal rate.  Pulmonary:     Effort: Pulmonary effort is normal. No respiratory distress.  Skin:    General: Skin is warm and dry.  Neurological:     Mental Status: She is alert.     Comments: Normal coordination, normal gait, no facial droop, normal speech  Psychiatric:        Mood and Affect: Mood normal.        Thought Content: Thought content normal.     UC Treatments / Results  Labs (all labs ordered are listed, but only abnormal results are displayed) Labs Reviewed - No data to display  EKG   Radiology No results found.  Procedures Procedures (including critical care time)  Medications Ordered in UC Medications - No data to display  Initial Impression / Assessment and Plan / UC Course  I have reviewed the triage vital signs and the nursing notes.  Pertinent labs & imaging results that were available during my care of the patient were reviewed by me and considered in my medical decision making (see chart for details).    Will change amoxicillin to augmentin to cover  possible sinusitis and prednisone prescribed to hopefully improve any ETD as patient reports she does not tolerate flonase. Meclizine prescribed for suspected vertigo. Encouraged follow up if symptoms do not improve or worsen in any way.   Final Clinical Impressions(s) / UC Diagnoses   Final diagnoses:  Right ear pain  Benign paroxysmal positional vertigo, unspecified laterality   Discharge Instructions   None    ED Prescriptions     Medication Sig Dispense Auth. Provider   amoxicillin-clavulanate (AUGMENTIN) 875-125 MG tablet Take 1 tablet by mouth every 12 (twelve) hours. 14 tablet Ewell Poe F, PA-C   predniSONE (DELTASONE) 20 MG tablet Take 2 tablets (40 mg total) by mouth daily with breakfast for 5 days. 10 tablet Ewell Poe F, PA-C   meclizine (ANTIVERT) 25 MG tablet Take 1 tablet (25 mg total) by mouth 3 (three) times daily as needed for dizziness. 30 tablet Francene Finders, PA-C      PDMP not reviewed this encounter.   Francene Finders, PA-C 06/30/21 1932

## 2021-06-30 NOTE — ED Triage Notes (Signed)
A few days h/o right ear fullness tx with amoxicillin and mucinex following an [employer] virtual visit. Two day h/o dizziness. Pt describes dizziness as "someone pushing back" on her causing difficulty standing and walking. No syncope episodes.

## 2021-12-06 ENCOUNTER — Ambulatory Visit: Payer: Self-pay

## 2021-12-06 ENCOUNTER — Other Ambulatory Visit: Payer: Self-pay | Admitting: Nurse Practitioner

## 2021-12-06 DIAGNOSIS — M25512 Pain in left shoulder: Secondary | ICD-10-CM

## 2023-01-05 ENCOUNTER — Ambulatory Visit
Admission: EM | Admit: 2023-01-05 | Discharge: 2023-01-05 | Disposition: A | Discharge status: Home / Self Care | Payer: 59

## 2023-01-05 DIAGNOSIS — R059 Cough, unspecified: Secondary | ICD-10-CM

## 2023-01-05 DIAGNOSIS — R0602 Shortness of breath: Secondary | ICD-10-CM

## 2023-01-05 HISTORY — DX: Post-traumatic stress disorder, chronic: F43.12

## 2023-01-05 MED ORDER — PREDNISONE 10 MG (21) PO TBPK: ORAL_TABLET | Freq: Every day | ORAL | 0 refills | Status: DC

## 2023-01-05 NOTE — Discharge Instructions (Addendum)
Advised patient to take medication as directed with food to completion.  Encouraged increase daily water intake to 64 ounces per day while taking this medication.  Advised if symptoms worsen and/or unresolved please follow-up with PCP or here for further evaluation. 

## 2023-01-05 NOTE — ED Provider Notes (Signed)
[name]    CSN: [id number] Arrival date & time: 01/05/23  1304      History   Chief Complaint Chief Complaint  Patient presents with   Cough   Shortness of Breath    HPI Jennifer Singh is a 32 y.o. female.   HPI 32 year old female presents with cough and shortness of breath for 3 days.  Patient reports was Positive for COVID-19 on 12/28/2022.  Patient did not take Paxlovid.  PMH significant for obesity, ADHD, and oppositional defiant disorder.  Past Medical History:  Diagnosis Date   ADHD (attention deficit hyperactivity disorder)    Anxiety    Central auditory processing disorder (CAPD)    Chronic post-traumatic stress disorder (PTSD)    Colitis    Dysmenorrhea    Obsessive-compulsive disorder    Oppositional defiant disorder    Syncopal episodes     Patient Active Problem List   Diagnosis Date Noted   Palpitations 10/18/2016   Collapse 10/18/2016   Weight gain 12/25/2013   Left knee pain 05/08/2013   OCD (obsessive compulsive disorder) 06/08/2011    Past Surgical History:  Procedure Laterality Date   BICEPS TENDON REPAIR     WISDOM TOOTH EXTRACTION      OB History     Gravida  0   Para      Term      Preterm      AB      Living         SAB      IAB      Ectopic      Multiple      Live Births               Home Medications    Prior to Admission medications   Medication Sig Start Date End Date Taking? Authorizing Provider  ALPRAZolam Prudy Feeler) 0.5 MG tablet Take 0.5 mg by mouth daily as needed. 05/20/21  Yes [provider]  cetirizine (ZYRTEC) 10 MG tablet Take 30 mg by mouth daily.   Yes [provider]  FLUoxetine (PROZAC) 20 MG capsule 40 mg. 08/24/20  Yes [provider]  lisdexamfetamine (VYVANSE) 30 MG chewable tablet Chew 30 mg by mouth daily.   Yes [provider]  lithium carbonate 150 MG capsule Take 150 mg by mouth daily. 06/09/21  Yes [provider]   predniSONE (STERAPRED UNI-PAK 21 TAB) 10 MG (21) TBPK tablet Take by mouth daily. Take 6 tabs by mouth daily  for 2 days, then 5 tabs for 2 days, then 4 tabs for 2 days, then 3 tabs for 2 days, 2 tabs for 2 days, then 1 tab by mouth daily for 2 days 01/05/23  Yes Trevor Iha, FNP  traZODone (DESYREL) 50 MG tablet Take 50 mg by mouth at bedtime. 06/09/21   [provider]  amphetamine-dextroamphetamine (ADDERALL XR) 5 MG 24 hr capsule Take 1 capsule (5 mg total) by mouth daily. 10/27/15 11/18/15  Thresa Ross, MD  ARIPiprazole (ABILIFY) 5 MG tablet Take 1 tablet (5 mg total) by mouth daily. 02/03/15 02/13/15  Thresa Ross, MD  gabapentin (NEURONTIN) 300 MG capsule Take 1 capsule (300 mg total) by mouth at bedtime. 11/18/15 02/02/16  Thresa Ross, MD    Family History Family History  Problem Relation Age of Onset   ADD / ADHD Mother    Thyroid disease Brother    Hypertension Brother    Hyperlipidemia Brother    Diabetes Brother  Hyperlipidemia Father    Hypertension Father    Bladder Cancer Father    Thyroid disease Father    Depression Maternal Uncle    Alcohol abuse Maternal Grandfather    Liver cancer Maternal Grandfather    Hypertension Maternal Grandfather    Hyperlipidemia Maternal Grandfather    OCD Maternal Grandmother    Heart attack Maternal Grandmother    Heart Problems Maternal Grandmother     Social History Social History   Tobacco Use   Smoking status: Former    Types: Cigarettes   Smokeless tobacco: Never   Tobacco comments:    smoked on and off from age 18 to 26  Substance Use Topics   Alcohol use: Yes    Alcohol/week: 6.0 standard drinks of alcohol    Types: 6 Standard drinks or equivalent per week    Comment: social   Drug use: No     Allergies   Ciprofloxacin, Penicillins, and Clindamycin/lincomycin   Review of Systems Review of Systems  Respiratory:  Positive for cough.      Physical Exam Triage Vital Signs ED Triage Vitals  01/05/23 1321  Encounter Vitals Group     BP 126/86     Systolic BP Percentile      Diastolic BP Percentile      Pulse Rate (!) 108     Resp (!) 22     Temp 99.1 F (37.3 C)     Temp Source Oral     SpO2      Weight      Height      Head Circumference      Peak Flow      Pain Score      Pain Loc      Pain Education      Exclude from Growth Chart    No data found.  Updated Vital Signs BP 126/86 (BP Location: Right Arm)   Pulse (!) 108   Temp 99.1 F (37.3 C) (Oral)   Resp (!) 22   LMP 12/08/2022 (Exact Date)    Physical Exam Vitals and nursing note reviewed.  Constitutional:      General: She is not in acute distress.    Appearance: Normal appearance. She is obese. She is not ill-appearing.  HENT:     Head: Normocephalic and atraumatic.     Mouth/Throat:     Mouth: Mucous membranes are moist.     Pharynx: Oropharynx is clear.  Eyes:     Extraocular Movements: Extraocular movements intact.     Conjunctiva/sclera: Conjunctivae normal.     Pupils: Pupils are equal, round, and reactive to light.  Cardiovascular:     Rate and Rhythm: Normal rate and regular rhythm.     Pulses: Normal pulses.     Heart sounds: Normal heart sounds.  Pulmonary:     Effort: Pulmonary effort is normal.     Breath sounds: Normal breath sounds. No wheezing, rhonchi or rales.     Comments: Infrequent nonproductive cough noted on exam Musculoskeletal:        General: Normal range of motion.     Cervical back: Normal range of motion and neck supple.  Skin:    General: Skin is warm and dry.  Neurological:     General: No focal deficit present.     Mental Status: She is alert and oriented to person, place, and time. Mental status is at baseline.  Psychiatric:        Mood and Affect: Mood  normal.        Behavior: Behavior normal.        Thought Content: Thought content normal.      UC Treatments / Results  Labs (all labs ordered are listed, but only abnormal results are  displayed) Labs Reviewed - No data to display  EKG   Radiology No results found.  Procedures Procedures (including critical care time)  Medications Ordered in UC Medications - No data to display  Initial Impression / Assessment and Plan / UC Course  I have reviewed the triage vital signs and the nursing notes.  Pertinent labs & imaging results that were available during my care of the patient were reviewed by me and considered in my medical decision making (see chart for details).     MDM: 1.  Cough-Rx'd Sterapred Unipak (tapering from 60 mg to 10 mg over 10 days);  2.  Shortness of breath-Rx'd Sterapred Unipak tapering from 60 mg to 10 mg over 10 days. Advised patient to take medication as directed with food to completion.  Encouraged increase daily water intake to 64 ounces per day while taking this medication.  Advised if symptoms worsen and/or unresolved please follow-up with PCP or here for further evaluation.  Patient discharged home, hemodynamically stable.  Work note provided to patient per request prior to discharge. Final Clinical Impressions(s) / UC Diagnoses   Final diagnoses:  Cough, unspecified type  Shortness of breath     Discharge Instructions      Advised patient to take medication as directed with food to completion.  Encouraged increase daily water intake to 64 ounces per day while taking this medication.  Advised if symptoms worsen and/or unresolved please follow-up with PCP or here for further evaluation.     ED Prescriptions     Medication Sig Dispense Auth. Provider   predniSONE (STERAPRED UNI-PAK 21 TAB) 10 MG (21) TBPK tablet Take by mouth daily. Take 6 tabs by mouth daily  for 2 days, then 5 tabs for 2 days, then 4 tabs for 2 days, then 3 tabs for 2 days, 2 tabs for 2 days, then 1 tab by mouth daily for 2 days 42 tablet Trevor Iha, FNP      PDMP not reviewed this encounter.   Trevor Iha, FNP 01/05/23 1434

## 2023-01-05 NOTE — ED Triage Notes (Signed)
Pt tested positive for COVID 08/21.  Feeling worse last couple days. SOB, dry cough. When she can cough something up, it is brown. Syncopal episode two days ago.

## 2023-04-13 ENCOUNTER — Ambulatory Visit: Payer: Self-pay

## 2023-07-17 ENCOUNTER — Telehealth: Payer: Self-pay | Admitting: Physician Assistant

## 2023-07-17 NOTE — Telephone Encounter (Signed)
 Copied from CRM [id number]. Topic: Appointments - Scheduling Inquiry for Clinic >> Jul 17, 2023  9:20 AM Nila Nephew wrote: Reason for CRM: Patient is calling to request to est with PCP. Patient would like to establish care with [facility]. Patient is extremely close with [facility], a current patient and states she was referred by her and several coworkers.  Current provider is not providing adequate care and patient is hoping to see her soon. OK to leave message on number on file.

## 2023-07-17 NOTE — Telephone Encounter (Signed)
 Called patient left vm to call back and schedule an appointment it is ok'd per [facility] to see as as a new patient.

## 2023-07-24 ENCOUNTER — Ambulatory Visit: Admitting: Physician Assistant

## 2023-07-24 ENCOUNTER — Encounter: Payer: Self-pay | Admitting: Physician Assistant

## 2023-07-24 VITALS — BP 110/76 | HR 103 | Ht 61.02 in | Wt 160.2 lb

## 2023-07-24 DIAGNOSIS — E6609 Other obesity due to excess calories: Secondary | ICD-10-CM | POA: Diagnosis not present

## 2023-07-24 DIAGNOSIS — M25512 Pain in left shoulder: Secondary | ICD-10-CM | POA: Diagnosis not present

## 2023-07-24 DIAGNOSIS — Z6830 Body mass index (BMI) 30.0-30.9, adult: Secondary | ICD-10-CM

## 2023-07-24 DIAGNOSIS — R140 Abdominal distension (gaseous): Secondary | ICD-10-CM

## 2023-07-24 DIAGNOSIS — Z8349 Family history of other endocrine, nutritional and metabolic diseases: Secondary | ICD-10-CM

## 2023-07-24 DIAGNOSIS — E66811 Obesity, class 1: Secondary | ICD-10-CM | POA: Diagnosis not present

## 2023-07-24 DIAGNOSIS — G8929 Other chronic pain: Secondary | ICD-10-CM

## 2023-07-24 DIAGNOSIS — T887XXA Unspecified adverse effect of drug or medicament, initial encounter: Secondary | ICD-10-CM

## 2023-07-24 NOTE — Patient Instructions (Addendum)
 Slenderiix over the counter  Miralax packets   Naltrexone; Bupropion Extended-Release Tablets What is this medication? NALTREXONE; BUPROPION (nal TREX one; byoo PROE pee on) promotes weight loss. It may also be used to maintain weight loss. It works by decreasing appetite. Changes to diet and exercise are often combined with this medication. This medicine may be used for other purposes; ask your health care provider or pharmacist if you have questions. COMMON BRAND NAME(S): Contrave What should I tell my care team before I take this medication? They need to know if you have any of these conditions: An eating disorder, such as anorexia or bulimia Diabetes Depression Frequently drink alcohol Glaucoma Head injury Heart disease High blood pressure History of a tumor or infection of your brain or spine History of heart attack or stroke History of irregular heartbeat History of substance use disorder or alcohol use disorder Kidney disease Liver disease Low levels of sodium in the blood Mental health condition Seizures Suicidal thoughts, plans, or attempt by you or a family member Taken an MAOI like Carbex, Eldepryl, Marplan, Nardil, or Parnate in last 14 days An unusual or allergic reaction to bupropion, naltrexone, other medications, foods, dyes, or preservatives Breast-feeding Pregnant or trying to become pregnant How should I use this medication? Take this medication by mouth with a full glass of water. Take it as directed on the prescription label at the same time every day. Do not cut, crush, or chew this medication. Swallow the tablets whole. You can take it with or without food. Do not take with high-fat meals as this may increase your risk of seizures. Keep taking it unless your care team tells you to stop. A special MedGuide will be given to you by the pharmacist with each prescription and refill. Be sure to read this information carefully each time. Talk to your care team  about the use of this medication in children. Special care may be needed. Overdosage: If you think you have taken too much of this medicine contact a poison control center or emergency room at once. NOTE: This medicine is only for you. Do not share this medicine with others. What if I miss a dose? If you miss a dose, skip it. Take your next dose at the normal time. Do not take extra or 2 doses at the same time to make up for the missed dose. What may interact with this medication? Do not take this medication with any of the following: Any medications used to stop taking opioids, such as methadone or buprenorphine Linezolid MAOIs, such as Carbex, Eldepryl, Marplan, Nardil, and Parnate Methylene blue (injected into a vein) Opioid medications Other medications that contain bupropion, such as Zyban or Wellbutrin This medication may also interact with the following: Alcohol Certain medications for blood pressure, such as metoprolol, propranolol Certain medications for depression, anxiety, or mental health conditions Certain medications for HIV or hepatitis Certain medications for irregular heart beat, such as propafenone, flecainide Certain medications for Parkinson disease, such as amantadine, levodopa Certain medications for seizures, such as carbamazepine, phenytoin, phenobarbital Certain medications for sleep Cimetidine Clopidogrel Cyclophosphamide Digoxin Disulfiram Furazolidone Isoniazid Nicotine Orphenadrine Procarbazine Steroid medications, such as prednisone or cortisone Stimulant medications for attention disorders, weight loss, or to stay awake Tamoxifen Theophylline Thiotepa Ticlopidine Tramadol Warfarin This list may not describe all possible interactions. Give your health care provider a list of all the medicines, herbs, non-prescription drugs, or dietary supplements you use. Also tell them if you smoke, drink alcohol, or use illegal  drugs. Some items may interact  with your medicine. What should I watch for while using this medication? Visit your care team for regular checks on your progress. This medication may cause serious skin reactions. They can happen weeks to months after starting the medication. Contact your care team right away if you notice fevers or flu-like symptoms with a rash. The rash may be red or purple and then turn into blisters or peeling of the skin. Or, you might notice a red rash with swelling of the face, lips, or lymph nodes in your neck or under your arms. This medication may affect blood sugar. Ask your care team if changes in diet or medications are needed if you have diabetes. Patients and their families should watch out for new or worsening depression or thoughts of suicide. This includes sudden changes in mood, behaviors, or thoughts. These changes can happen at any time but are more common in the beginning of treatment or after a change in dose. Call your care team right away if you experience these thoughts or worsening depression. Avoid alcoholic drinks while taking this medication. Drinking large amounts of alcoholic beverages, using sleeping or anxiety medications, or quickly stopping the use of these agents while taking this medication may increase your risk for a seizure. Do not drive or use heavy machinery until you know how this medication affects you. This medication can impair your ability to perform these tasks. Inform your care team if you wish to become pregnant or think you might be pregnant. Losing weight while pregnant is not advised and may cause harm to the unborn child. Talk to your care team for more information. What side effects may I notice from receiving this medication? Side effects that you should report to your care team as soon as possible: Allergic reactions--skin rash, itching, hives, swelling of the face, lips, tongue, or throat Fast or irregular heartbeat Increase in blood pressure Liver  injury--right upper belly pain, loss of appetite, nausea, light-colored stool, dark yellow or brown urine, yellowing skin or eyes, unusual weakness or fatigue Mood and behavior changes--anxiety, nervousness, confusion, hallucinations, irritability, hostility, thoughts of suicide or self-harm, worsening mood, feelings of depression Redness, blistering, peeling, or loosening of the skin, including inside the mouth Seizures Sudden eye pain or change in vision such as blurry vision, seeing halos around lights, vision loss Side effects that usually do not require medical attention (report to your care team if they continue or are bothersome): Constipation Dizziness Dry mouth Fatigue Headache Nausea Trouble sleeping Vomiting This list may not describe all possible side effects. Call your doctor for medical advice about side effects. You may report side effects to FDA at 1-800-FDA-1088. Where should I keep my medication? Keep out of the reach of children and pets. Store between 15 and 30 degrees C (59 and 86 degrees F). Get rid of any unused medication after the expiration date. To get rid of medications that are no longer needed or have expired: Take the medication to a medication take-back program. Check with your pharmacy or law enforcement to find a location. If you cannot return the medication, check the label or package insert to see if the medication should be thrown out in the garbage or flushed down the toilet. If you are not sure, ask your care team. If it is safe to put it in the trash, pour the medication out of the container. Mix the medication with cat litter, dirt, coffee grounds, or other unwanted substance. Seal the  mixture in a bag or container. Put it in the trash. NOTE: This sheet is a summary. It may not cover all possible information. If you have questions about this medicine, talk to your doctor, pharmacist, or health care provider.  2024 Elsevier/Gold Standard (2021-06-21  00:00:00)

## 2023-07-24 NOTE — Progress Notes (Unsigned)
 New Patient Office Visit  Subjective    Patient ID: Jennifer Singh, female    DOB: 29-Jun-1990  Age: 33 y.o. MRN: [id number]  CC:  Chief Complaint  Patient presents with   New Patient (Initial Visit)    Est. Care    HPI Jennifer Singh presents to establish care ***  Assalt work 2022 SLAP tear  Sept 15 23  BH concerta to vyvanse   Wegovy nausea fever chills and vomiting diarrhea  Not drinking alcohol  Obesity   Outpatient Encounter Medications as of 07/24/2023  Medication Sig   ALPRAZolam (XANAX) 0.5 MG tablet Take 0.5 mg by mouth daily as needed.   cetirizine (ZYRTEC) 10 MG tablet Take 30 mg by mouth daily.   FLUoxetine (PROZAC) 20 MG capsule 40 mg.   lisdexamfetamine (VYVANSE) 30 MG chewable tablet Chew 30 mg by mouth daily.   [DISCONTINUED] amphetamine-dextroamphetamine (ADDERALL XR) 5 MG 24 hr capsule Take 1 capsule (5 mg total) by mouth daily.   [DISCONTINUED] ARIPiprazole (ABILIFY) 5 MG tablet Take 1 tablet (5 mg total) by mouth daily.   [DISCONTINUED] gabapentin (NEURONTIN) 300 MG capsule Take 1 capsule (300 mg total) by mouth at bedtime.   [DISCONTINUED] lithium carbonate 150 MG capsule Take 150 mg by mouth daily. (Patient not taking: Reported on 07/24/2023)   [DISCONTINUED] predniSONE (STERAPRED UNI-PAK 21 TAB) 10 MG (21) TBPK tablet Take by mouth daily. Take 6 tabs by mouth daily  for 2 days, then 5 tabs for 2 days, then 4 tabs for 2 days, then 3 tabs for 2 days, 2 tabs for 2 days, then 1 tab by mouth daily for 2 days   [DISCONTINUED] traZODone (DESYREL) 50 MG tablet Take 50 mg by mouth at bedtime.   No facility-administered encounter medications on file as of 07/24/2023.    Past Medical History:  Diagnosis Date   ADHD (attention deficit hyperactivity disorder)    Anxiety    Central auditory processing disorder (CAPD)    Chronic post-traumatic stress disorder (PTSD)    Colitis    Dysmenorrhea    Obsessive-compulsive disorder    Oppositional defiant  disorder    Syncopal episodes     Past Surgical History:  Procedure Laterality Date   BICEPS TENDON REPAIR     WISDOM TOOTH EXTRACTION      Family History  Problem Relation Age of Onset   ADD / ADHD Mother    Thyroid disease Brother    Hypertension Brother    Hyperlipidemia Brother    Diabetes Brother    Hyperlipidemia Father    Hypertension Father    Bladder Cancer Father    Thyroid disease Father    Depression Maternal Uncle    Alcohol abuse Maternal Grandfather    Liver cancer Maternal Grandfather    Hypertension Maternal Grandfather    Hyperlipidemia Maternal Grandfather    OCD Maternal Grandmother    Heart attack Maternal Grandmother    Heart Problems Maternal Grandmother     Social History   Socioeconomic History   Marital status: Married    Spouse name: Not on file   Number of children: Not on file   Years of education: Not on file   Highest education level: Not on file  Occupational History   Not on file  Tobacco Use   Smoking status: Former    Types: Cigarettes   Smokeless tobacco: Never   Tobacco comments:    smoked on and off from age 60 to 52  Substance  and Sexual Activity   Alcohol use: Yes    Alcohol/week: 6.0 standard drinks of alcohol    Types: 6 Standard drinks or equivalent per week    Comment: social   Drug use: No   Sexual activity: Yes    Birth control/protection: Sponge    Comment: lesbian  Other Topics Concern   Not on file  Social History Narrative   Not on file   Social Drivers of Health   Financial Resource Strain: Low Risk  (06/27/2023)   Received from [facility]   Overall Financial Resource Strain (CARDIA)    Difficulty of Paying Living Expenses: Not hard at all  Food Insecurity: No Food Insecurity (06/27/2023)   Received from [facility]   Hunger Vital Sign    Worried About Running Out of Food in the Last Year: Never true    Ran Out of Food in the Last Year: Never true  Transportation Needs: No Transportation  Needs (06/27/2023)   Received from [facility] - Transportation    Lack of Transportation (Medical): No    Lack of Transportation (Non-Medical): No  Physical Activity: Sufficiently Active (02/01/2023)   Received from [facility]   Exercise Vital Sign    Days of Exercise per Week: 4 days    Minutes of Exercise per Session: 50 min  Stress: No Stress Concern Present (02/01/2023)   Received from [facility] - Occupational Stress Questionnaire    Feeling of Stress : Not at all  Social Connections: Socially Integrated (02/01/2023)   Received from [facility]   Social Network    How would you rate your social network (family, work, friends)?: Good participation with social networks  Intimate Partner Violence: Not At Risk (02/01/2023)   Received from [facility]   HITS    Over the last 12 months how often did your partner physically hurt you?: Never    Over the last 12 months how often did your partner insult you or talk down to you?: Never    Over the last 12 months how often did your partner threaten you with physical harm?: Never    Over the last 12 months how often did your partner scream or curse at you?: Never    ROS      Objective    BP 110/76 (BP Location: Left Arm, Patient Position: Sitting, Cuff Size: Large)   Pulse (!) 103   Ht 5' 1.02" (1.55 m)   Wt 160 lb 4 oz (72.7 kg)   SpO2 98%   BMI 30.26 kg/m   Physical Exam  {Labs (Optional):23779}    Assessment & Plan:   Problem List Items Addressed This Visit   None   No follow-ups on file.   Tandy Gaw, PA-C

## 2023-07-25 ENCOUNTER — Encounter: Payer: Self-pay | Admitting: Physician Assistant

## 2023-07-25 DIAGNOSIS — E6609 Other obesity due to excess calories: Secondary | ICD-10-CM | POA: Insufficient documentation

## 2023-07-25 DIAGNOSIS — G8929 Other chronic pain: Secondary | ICD-10-CM | POA: Insufficient documentation

## 2023-07-25 DIAGNOSIS — E66811 Obesity, class 1: Secondary | ICD-10-CM | POA: Insufficient documentation

## 2023-07-25 LAB — CBC WITH DIFFERENTIAL/PLATELET
Basophils Absolute: 0.1 10*3/uL (ref 0.0–0.2)
Basos: 1 %
EOS (ABSOLUTE): 0.4 10*3/uL (ref 0.0–0.4)
Eos: 8 %
Hematocrit: 43.3 % (ref 34.0–46.6)
Hemoglobin: 14.6 g/dL (ref 11.1–15.9)
Immature Grans (Abs): 0 10*3/uL (ref 0.0–0.1)
Immature Granulocytes: 0 %
Lymphocytes Absolute: 1.9 10*3/uL (ref 0.7–3.1)
Lymphs: 35 %
MCH: 30.9 pg (ref 26.6–33.0)
MCHC: 33.7 g/dL (ref 31.5–35.7)
MCV: 92 fL (ref 79–97)
Monocytes Absolute: 0.3 10*3/uL (ref 0.1–0.9)
Monocytes: 5 %
Neutrophils Absolute: 2.8 10*3/uL (ref 1.4–7.0)
Neutrophils: 51 %
Platelets: 326 10*3/uL (ref 150–450)
RBC: 4.72 x10E6/uL (ref 3.77–5.28)
RDW: 11.9 % (ref 11.7–15.4)
WBC: 5.4 10*3/uL (ref 3.4–10.8)

## 2023-07-25 LAB — THYROID PEROXIDASE ANTIBODY: Thyroperoxidase Ab SerPl-aCnc: 13 [IU]/mL (ref 0–34)

## 2023-07-25 LAB — IRON,TIBC AND FERRITIN PANEL
Ferritin: 51 ng/mL (ref 15–150)
Iron Saturation: 22 % (ref 15–55)
Iron: 71 ug/dL (ref 27–159)
Total Iron Binding Capacity: 327 ug/dL (ref 250–450)
UIBC: 256 ug/dL (ref 131–425)

## 2023-07-25 LAB — CMP14+EGFR
ALT: 17 IU/L (ref 0–32)
AST: 17 IU/L (ref 0–40)
Albumin: 4.6 g/dL (ref 3.9–4.9)
Alkaline Phosphatase: 98 IU/L (ref 44–121)
BUN/Creatinine Ratio: 13 (ref 9–23)
BUN: 10 mg/dL (ref 6–20)
Bilirubin Total: 0.5 mg/dL (ref 0.0–1.2)
CO2: 23 mmol/L (ref 20–29)
Calcium: 9.7 mg/dL (ref 8.7–10.2)
Chloride: 103 mmol/L (ref 96–106)
Creatinine, Ser: 0.78 mg/dL (ref 0.57–1.00)
Globulin, Total: 2.3 g/dL (ref 1.5–4.5)
Glucose: 84 mg/dL (ref 70–99)
Potassium: 4.4 mmol/L (ref 3.5–5.2)
Sodium: 138 mmol/L (ref 134–144)
Total Protein: 6.9 g/dL (ref 6.0–8.5)
eGFR: 103 mL/min/{1.73_m2} (ref >59)

## 2023-07-25 LAB — TSH+FREE T4
Free T4: 1.24 ng/dL (ref 0.82–1.77)
TSH: 1.37 u[IU]/mL (ref 0.450–4.500)

## 2023-07-25 MED ORDER — PHENTERMINE HCL 37.5 MG PO TABS: ORAL_TABLET | ORAL | 0 refills | Status: DC

## 2023-07-25 NOTE — Telephone Encounter (Signed)
 Patient sent another message stating the prescription was filled.

## 2023-07-25 NOTE — Telephone Encounter (Signed)
 Patient sent another message stating the medication has been filled.

## 2023-07-25 NOTE — Progress Notes (Signed)
 Jennifer Singh,   Kidney, liver, glucose looks GREAT.  Normal hemoglobin and iron stores.  Thyroid looks wonderful and negative for any antibodies.   GREAT labs.

## 2023-08-17 ENCOUNTER — Other Ambulatory Visit: Payer: Self-pay

## 2023-08-17 ENCOUNTER — Encounter: Payer: Self-pay | Admitting: Physician Assistant

## 2023-08-17 ENCOUNTER — Telehealth: Payer: Self-pay | Admitting: Physician Assistant

## 2023-08-17 NOTE — Telephone Encounter (Signed)
 Request has already been sent over to PCP for her to review.

## 2023-08-17 NOTE — Telephone Encounter (Signed)
 Patient advising on Lamictal dosage.  Copied from CRM [id number]. Topic: Clinical - Prescription Issue >> Aug 17, 2023  1:29 PM Jennifer Singh wrote: Reason for CRM: Patient called and wanted to give this information to the triage nurse about her rx, 150 mg once a day at night time

## 2023-08-18 MED ORDER — LAMOTRIGINE 150 MG PO TABS: 150.0000 mg | ORAL_TABLET | Freq: Every day | ORAL | 1 refills | Status: AC

## 2023-08-21 ENCOUNTER — Encounter: Payer: Self-pay | Admitting: Physician Assistant

## 2023-08-21 ENCOUNTER — Ambulatory Visit: Admitting: Physician Assistant

## 2023-08-21 VITALS — BP 110/80 | HR 93 | Ht 61.0 in | Wt 160.0 lb

## 2023-08-21 DIAGNOSIS — Z6830 Body mass index (BMI) 30.0-30.9, adult: Secondary | ICD-10-CM

## 2023-08-21 DIAGNOSIS — E6609 Other obesity due to excess calories: Secondary | ICD-10-CM | POA: Diagnosis not present

## 2023-08-21 DIAGNOSIS — E66811 Obesity, class 1: Secondary | ICD-10-CM

## 2023-08-21 MED ORDER — TOPIRAMATE 25 MG PO TABS: 25.0000 mg | ORAL_TABLET | Freq: Every day | ORAL | 0 refills | Status: DC

## 2023-08-21 NOTE — Progress Notes (Signed)
   Established Patient Office Visit  Subjective   Patient ID: Jennifer Singh, female    DOB: 01/08/1991  Age: 33 y.o. MRN: [id number]  Chief Complaint  Patient presents with   Medical Management of Chronic Issues    Wgt management     HPI Pt is a 33 yo female who presents to the clinic for weight. She started phentermine and loves the focus but not losing weight. She is moving regularly and trying to eat healthy but not logging anything.   Review of Systems  All other systems reviewed and are negative.     Objective:     BP 110/80   Pulse 93   Ht 5\' 1"  (1.549 m)   Wt 160 lb (72.6 kg)   SpO2 100%   BMI 30.23 kg/m  BP Readings from Last 3 Encounters:  08/21/23 110/80  07/24/23 110/76  01/05/23 126/86   Wt Readings from Last 3 Encounters:  08/21/23 160 lb (72.6 kg)  07/24/23 160 lb 4 oz (72.7 kg)  12/25/19 170 lb 3.2 oz (77.2 kg)      Physical Exam Constitutional:      Appearance: Normal appearance. She is obese.  HENT:     Head: Normocephalic.  Cardiovascular:     Rate and Rhythm: Normal rate.  Pulmonary:     Effort: Pulmonary effort is normal.  Neurological:     General: No focal deficit present.     Mental Status: She is alert.  Psychiatric:        Mood and Affect: Mood normal.        Assessment & Plan:  Aaron AasAaron AasChelsea was seen today for medical management of chronic issues.  Diagnoses and all orders for this visit:  Class 1 obesity due to excess calories without serious comorbidity with body mass index (BMI) of 30.0 to 30.9 in adult -     topiramate (TOPAMAX) 25 MG tablet; Take 1 tablet (25 mg total) by mouth at bedtime.   Added topamax at bedtime Continue phentermine Consider weight watchers and actually log food and exercise Consider ashwaganda for mood and adrenal support Follow up in 3 months   Sandy Crumb, PA-C

## 2023-08-21 NOTE — Patient Instructions (Signed)
 Start topamax at bedtime.  Continue phentermine.

## 2023-09-15 ENCOUNTER — Encounter: Payer: Self-pay | Admitting: Physician Assistant

## 2023-09-20 ENCOUNTER — Other Ambulatory Visit: Payer: Self-pay | Admitting: Physician Assistant

## 2023-10-10 ENCOUNTER — Encounter: Payer: Self-pay | Admitting: Physician Assistant

## 2023-10-10 MED ORDER — FLUOXETINE HCL 20 MG PO CAPS
40.0000 mg | ORAL_CAPSULE | Freq: Every day | ORAL | 1 refills | Status: AC
Start: 2023-10-10 — End: ?

## 2023-10-10 NOTE — Telephone Encounter (Signed)
 Requesting rx rf of duloxetine 20mg   Last written by historical provider Last OV 08/21/2023 Upcoming [id number]

## 2023-10-23 ENCOUNTER — Encounter: Payer: Self-pay | Admitting: Physician Assistant

## 2023-10-24 NOTE — Telephone Encounter (Signed)
 Jennifer Singh,  Please see mychart sent by pt about appt scheduled tomorrow 6/18.

## 2023-10-25 ENCOUNTER — Telehealth (INDEPENDENT_AMBULATORY_CARE_PROVIDER_SITE_OTHER): Admitting: Physician Assistant

## 2023-10-25 VITALS — BP 118/74 | HR 60 | Wt 156.0 lb

## 2023-10-25 DIAGNOSIS — K581 Irritable bowel syndrome with constipation: Secondary | ICD-10-CM | POA: Diagnosis not present

## 2023-10-25 DIAGNOSIS — R140 Abdominal distension (gaseous): Secondary | ICD-10-CM | POA: Diagnosis not present

## 2023-10-25 DIAGNOSIS — F902 Attention-deficit hyperactivity disorder, combined type: Secondary | ICD-10-CM

## 2023-10-25 DIAGNOSIS — E663 Overweight: Secondary | ICD-10-CM

## 2023-10-25 DIAGNOSIS — Z6829 Body mass index (BMI) 29.0-29.9, adult: Secondary | ICD-10-CM

## 2023-10-25 DIAGNOSIS — F331 Major depressive disorder, recurrent, moderate: Secondary | ICD-10-CM

## 2023-10-25 MED ORDER — LUBIPROSTONE 24 MCG PO CAPS: 24.0000 ug | ORAL_CAPSULE | Freq: Two times a day (BID) | ORAL | 2 refills | Status: DC

## 2023-10-25 MED ORDER — LISDEXAMFETAMINE DIMESYLATE 40 MG PO CAPS: 40.0000 mg | ORAL_CAPSULE | ORAL | 0 refills | Status: DC

## 2023-10-25 MED ORDER — CARIPRAZINE HCL 1.5 MG PO CAPS: 1.5000 mg | ORAL_CAPSULE | Freq: Every day | ORAL | 1 refills | Status: DC

## 2023-10-25 NOTE — Progress Notes (Unsigned)
..  Virtual Visit via Video Note  I connected with Jennifer Singh on 10/27/23 at  9:10 AM EDT by a video enabled telemedicine application and verified that I am speaking with the correct person using two identifiers.  Location: Patient: work Provider: clinic  .Aaron AasParticipating in visit:  Patient: Jennifer Singh Provider: Sandy Crumb PA-C   I discussed the limitations of evaluation and management by telemedicine and the availability of in person appointments. The patient expressed understanding and agreed to proceed.  History of Present Illness: Pt connects via video to discuss medications.   Stopped phentermine  last Monday. She did lose 4lbs while taking over the past 3 months. She has been very active and eating healthy. She has notice her ADHD is terrible when off phentermine  and ADHD medications. She wants to get started on something.   Long hx of constipation since 2019. Failed miralax, colace, herbal supplements, and diet changes. She is drinking 60 to 80oz of water a day.   Depression is worse. Very down. Does not think prozac  and lamictal  are working anymore. No SI/HC. Work has been very hard and stressful.   Constipation since colititis sept 2019.      Observations/Objective: No acute distress Normal mood and appearance Normal breathing  .Aaron Aas Today's Vitals   10/25/23 0911  BP: 118/74  Pulse: 60  Weight: 156 lb (70.8 kg)   Body mass index is 29.48 kg/m.    Assessment and Plan: Aaron AasAaron AasDiagnoses and all orders for this visit:  Irritable bowel syndrome with constipation -     lubiprostone (AMITIZA) 24 MCG capsule; Take 1 capsule (24 mcg total) by mouth 2 (two) times daily with a meal.  Overweight (BMI 25.0-29.9)  Bloating -     lubiprostone (AMITIZA) 24 MCG capsule; Take 1 capsule (24 mcg total) by mouth 2 (two) times daily with a meal.  Attention deficit hyperactivity disorder (ADHD), combined type -     lisdexamfetamine (VYVANSE ) 40 MG capsule; Take 1 capsule (40 mg  total) by mouth every morning.  Moderate episode of recurrent major depressive disorder (HCC) -     cariprazine (VRAYLAR) 1.5 MG capsule; Take 1 capsule (1.5 mg total) by mouth daily.   Stop phentermine  for weight loss, down 4lbs but not a sustainable way to keep weight off Pt is ADHD-trial of vyvanse  Constipation-failed miralax/colace/herbal treatments Trial of amitiza bid Mood not to goal Cut lamictal  in half daily and add vraylar with prozac  as adjunt treatment for depression Discussed medications and side effects Follow up in 4 weeks    Follow Up Instructions:    I discussed the assessment and treatment plan with the patient. The patient was provided an opportunity to ask questions and all were answered. The patient agreed with the plan and demonstrated an understanding of the instructions.   The patient was advised to call back or seek an in-person evaluation if the symptoms worsen or if the condition fails to improve as anticipated.  Win Guajardo, PA-C

## 2023-10-25 NOTE — Patient Instructions (Addendum)
 Stop phentermine  Start vyvanse  daily Starting amitiza for constipation with meals twice a day Cutting your lamictal  in half and starting vraylar with prozac  daily  Chronic Constipation Chronic constipation is when a person poops 3 times a week or less for 3 months or longer. It is most common in older adults. What are the causes? This condition may be caused by: Not drinking enough fluid, eating enough fiber, or getting enough exercise. Pregnancy. An anal fissure. This is a tear in the opening of the butt (anus). Bowel obstruction. This is when there is a blockage in your intestine (bowel). Bowel stricture. This is when your bowel narrows. A long-term (chronic) condition, such as: Diabetes or hypothyroidism. A stroke or injury to the spinal cord. Multiple sclerosis or Parkinson's disease. Colon cancer. Dementia. Inflammatory bowel disease (IBD), rectal prolapse, or hemorrhoids. Taking certain medicines. These include: Some pain medicines. Antacids or iron supplements. Water pills (diuretics). Some blood pressure medicines. Medicines to keep you from having seizures (anti-seizure medicines). Antidepressants. Medicines for Parkinson's disease. Other causes may include: Stress. Problems with the nerves and muscles that help you poop. Weak muscles in the area between your hip bones (pelvis). What increases the risk? You may be more at risk for this condition if: You are older than 33 years of age. You are female. You live in a long-term care facility. You have a chronic condition. You have a mental health disorder or eating disorder. What are the signs or symptoms? The main sign of this condition is pooping 3 times a week or less. Other signs and symptoms may include: Pushing hard (straining) to poop. Hard or lumpy poop (stool). Pain when you poop. Discomfort in your lower abdomen. This may include cramps and bloating. Not being able to poop when you need to. Feeling like  you still need to poop after you are done. Feeling like there is something in your rectum that is blocking or stopping you from pooping. Blood in your poop or seeing blood on the toilet paper after you poop. Confusion. This is most common in older adults. How is this diagnosed? This condition may be diagnosed based on your symptoms and medical history. You may also need: An exam of your abdomen. A digital rectal exam. For this exam, your health care provider will put a gloved finger into your rectum. Testing. Tests may include: Imaging studies, such as an X-ray, ultrasound, or CT scan. Blood tests. A colonoscopy. This is a test that looks at your colon. A test of how well your anal sphincter works. This is a muscle shaped like a ring. It helps your anus open and close. A test of how well food moves through your colon. An electromyography. This is a test of the nerves in your pelvis. How is this treated? Treatment depends on the cause. You may need to: Be more active. Drink more fluids. Add fiber to your diet. Sources of fiber include fruits, vegetables, and whole grains. You may also need to take a fiber supplement. Stop or change medicines that can cause constipation. Take medicines, such as: Pro-motility agents. These help your muscles tighten (contract). This can help you push out poop. Stool softeners. These soften your poop. An osmotic laxative. A laxative is a medicine that helps you poop. This type works by [employer] into your colon. Train the muscles in your pelvis with biofeedback. Have surgery. This may be done if something is blocking your bowels. You may also need to see an expert in problems  with the digestive system (gastroenterologist). Follow these instructions at home: Medicines Take over-the-counter and prescription medicines only as told by your provider. If you are taking a laxative, take it only as told by your provider. Eating and drinking  Drink enough  fluid to keep your pee (urine) pale yellow. Eat foods that are high in fiber, such as beans, whole grains, and fresh fruits and vegetables. Limit foods that are high in fat and processed sugars, such as fried or sweet foods. Stay away from alcohol, caffeine, and soda. These can make constipation worse. General instructions Get physical activity every day. Ask your provider what activities are safe for you. Get colon cancer screenings as told by your provider. Contact a health care provider if: You poop 3 times a week or less. Your poop is hard or lumpy. There is blood on the toilet paper or in your poop. You lose weight for no clear reason. You have pain in your rectum. Poop is leaking out of your rectum. You have nausea or vomiting. This information is not intended to replace advice given to you by your health care provider. Make sure you discuss any questions you have with your health care provider. Document Revised: 02/07/2022 Document Reviewed: 02/07/2022 Elsevier Patient Education  2024 [employer].

## 2023-10-26 ENCOUNTER — Encounter: Payer: Self-pay | Admitting: Physician Assistant

## 2023-10-26 NOTE — Telephone Encounter (Signed)
 A PA is required for the Amitiza rx. Thanks in advance.

## 2023-10-30 ENCOUNTER — Other Ambulatory Visit (HOSPITAL_COMMUNITY): Payer: Self-pay

## 2023-10-30 ENCOUNTER — Telehealth: Payer: Self-pay

## 2023-10-30 NOTE — Telephone Encounter (Signed)
 Pharmacy Patient Advocate Encounter   Received notification from Patient Advice Request messages that prior authorization for Lubriprostone 24mcg caps is required/requested.   Insurance verification completed.   The patient is insured through [facility] .   Per test claim: PA required; PA submitted to above mentioned insurance via CoverMyMeds Key/confirmation #/[id number] Status is pending

## 2023-10-31 ENCOUNTER — Other Ambulatory Visit (HOSPITAL_COMMUNITY): Payer: Self-pay

## 2023-10-31 NOTE — Telephone Encounter (Signed)
 Pharmacy Patient Advocate Encounter  Received notification from [facility] that Prior Authorization for Lubiprostone  24mcg caps has been APPROVED from 10/30/23 to 10/29/24. Ran test claim, Copay is $45. This test claim was processed through [facility]- copay amounts may vary at other pharmacies due to pharmacy/plan contracts, or as the patient moves through the different stages of their insurance plan.   PA #/Case ID/Reference #: [id number]

## 2023-11-03 ENCOUNTER — Encounter: Payer: Self-pay | Admitting: Physician Assistant

## 2023-11-03 NOTE — Telephone Encounter (Signed)
 Patient was seen in ER-  states starting Hep B series and given abx - she does not think she needs anything further from us  at this time.

## 2023-11-03 NOTE — Telephone Encounter (Signed)
 What is she talking about?  Exposure to what?  Did I miss something on this message

## 2023-11-12 ENCOUNTER — Encounter: Payer: Self-pay | Admitting: Physician Assistant

## 2023-11-13 MED ORDER — ONDANSETRON 4 MG PO TBDP: 4.0000 mg | ORAL_TABLET | Freq: Three times a day (TID) | ORAL | 0 refills | Status: DC | PRN

## 2023-11-13 NOTE — Telephone Encounter (Signed)
 Make sure written for yesterday.

## 2023-11-13 NOTE — Telephone Encounter (Signed)
 Ok for note for today

## 2023-11-13 NOTE — Telephone Encounter (Signed)
 It was written for yesterday.

## 2023-11-13 NOTE — Addendum Note (Signed)
 Addended by: BONNY JON DEL on: 11/13/2023 11:40 AM   Modules accepted: Orders

## 2023-11-13 NOTE — Addendum Note (Signed)
 Addended by: ANTONIETTE VERMELL CROME on: 11/13/2023 11:53 AM   Modules accepted: Orders

## 2023-11-15 ENCOUNTER — Ambulatory Visit: Payer: Self-pay

## 2023-11-15 ENCOUNTER — Ambulatory Visit

## 2023-11-15 ENCOUNTER — Other Ambulatory Visit: Payer: Self-pay

## 2023-11-15 ENCOUNTER — Ambulatory Visit
Admission: EM | Admit: 2023-11-15 | Discharge: 2023-11-15 | Disposition: A | Discharge status: Home / Self Care | Attending: Family Medicine | Admitting: Family Medicine

## 2023-11-15 DIAGNOSIS — R110 Nausea: Secondary | ICD-10-CM

## 2023-11-15 DIAGNOSIS — K5900 Constipation, unspecified: Secondary | ICD-10-CM | POA: Diagnosis not present

## 2023-11-15 DIAGNOSIS — T50905A Adverse effect of unspecified drugs, medicaments and biological substances, initial encounter: Secondary | ICD-10-CM | POA: Diagnosis not present

## 2023-11-15 DIAGNOSIS — K581 Irritable bowel syndrome with constipation: Secondary | ICD-10-CM

## 2023-11-15 DIAGNOSIS — R140 Abdominal distension (gaseous): Secondary | ICD-10-CM

## 2023-11-15 MED ORDER — AMOXICILLIN-POT CLAVULANATE 875-125 MG PO TABS: 1.0000 | ORAL_TABLET | Freq: Two times a day (BID) | ORAL | 0 refills | Status: DC

## 2023-11-15 NOTE — Telephone Encounter (Signed)
 FYI Only or Action Required?: Action required by provider: request for appointment.  Patient was last seen in primary care on 10/25/2023 by Antoniette Vermell CROME, PA-C.  Called Nurse Triage reporting Diarrhea.  Symptoms began today.  Interventions attempted: Prescription medications: Zofran  and Rest, hydration, or home remedies.  Symptoms are: gradually worsening.  Triage Disposition: See Physician Within 24 Hours  Patient/caregiver understands and will follow disposition?: Yes                             Copied from CRM [id number]. Topic: Clinical - Red Word Triage >> Nov 15, 2023 12:19 PM Jennifer Singh wrote: Red Word that prompted transfer to Nurse Triage: Patient has had diarrhea since 3am this morning. Zofran  is not working has taking 8 mg.  1. DIARRHEA SEVERITY: How bad is the diarrhea? How many more stools have you had in the past 24 hours than normal?  - NONE (SCALE 0): No diarrhea. - MILD (SCALE 1-3): Few loose or mushy stools; increase of 1 to 3 stools over normal daily number of stools; mild increase in ostomy output. - MODERATE (SCALE 4-7): Increase of 4-6 stools daily over normal; moderate increase in ostomy output. - SEVERE (SCALE 8-10; OR WORST POSSIBLE): Increase of 7 or more stools daily over normal; moderate increase in ostomy output; incontinence. States she has been having diarrhea since 3 AM  2. ONSET: When did the diarrhea begin?  Symptoms started Saturday   3. STOOL DESCRIPTION: How loose or watery is the diarrhea? What is the stool color? Is there any blood or mucous in the stool? Loose and watery   4. VOMITING: Are you also vomiting? If Yes, ask: How many times in the past 24 hours?  2 x in past 24 hours   5. ABDOMEN PAIN: Are you having any abdomen pain? If Yes, ask: What does it feel like? (e.g., crampy, dull, intermittent, constant)  Denies, just nausea  7. ORAL INTAKE: If vomiting, Have you been able to drink  liquids? How much liquids have you had in the past 24 hours?  8. HYDRATION: Any signs of dehydration? (e.g., dry mouth [not just dry lips], too weak to stand, dizziness, new weight loss) When did you last urinate? States she has attempted to drink liquids, but liquids come right  10. ANTIBIOTIC USE: Are you taking antibiotics now or have you taken antibiotics in the past 2 months? Denies  11. OTHER SYMPTOMS: Do you have any other symptoms? (e.g., fever, blood in stool) Nausea, not able to eat anything, denies blood in stool    Patient scheduled an appointment for Friday herself. This RN advised patient to be seen within 24 hours. No availability. Please contact patient to advise on scheduling.  Reason for Disposition  [1] MODERATE diarrhea (e.g., 4-6 times / day more than normal) AND [2] present > 48 hours (2 days)  Protocols used: Diarrhea-A-AH

## 2023-11-15 NOTE — Telephone Encounter (Signed)
 Spoke with patient.  C/o  recent changes in medication  Started amitiza ,  pre and probiotic , decreased in Lamictal  and started Vraylar .  Has been having  symptoms of intermittent nausea, diarrhea , and vomiting x Friday  11/10/23 No longer vomiting but has ongoing diarrhea since 3 am this morning.  Patient denies fever, or blood in stool. But states she is having pain that comes and goes . Has taken zofran  for nausea. She does have a history of colitis. Our office does not have any available appts today . Recommended that she go to urgent care for evaluation .she is agreeable to this. She does have upcoming appt with Jade Breeback for Friday 11/17/23 @ 11:30 am as well.

## 2023-11-15 NOTE — Telephone Encounter (Signed)
 Spoke with patient. She was seen at urgent care - given abx -  did x-ray - states colon was full. She is scheduled for f/u with Jade Breeback on Friday 11/17/23 @ 11:30am

## 2023-11-15 NOTE — ED Provider Notes (Signed)
 [facility]    CSN: [id number] Arrival date & time: 11/15/23  1427      History   Chief Complaint Chief Complaint  Patient presents with   Emesis   Diarrhea    HPI Jennifer Singh is a 33 y.o. female.   HPI  Patient states that she has had a lot of trouble with her bowels and intestine.  She states she has irritable bowel syndrome with constipation.  Chronic constipation.  Food intolerance.  A lot of bloating.  Has had a colonoscopy in the past.  Has had colitis in the past.  She states that she discussed this with her PCP and was started on Amatiza.  She is taking 24 mg twice a day.  First week she felt no symptoms at all on the medication, and no improvement.  This week she has been having increased bloating, cramping, and loose bowels.  She is having a lot of mucus in the diarrhea.  She states her abdominal cramping and diarrhea reminds her of when she had colitis.  She has not had fever or chills.  No blood in mucus.  She has had a lot of nausea.  She has lost weight.  Feels like she cannot eat.  Is taking Zofran .  Was unable to get in with her PCP today so came here.  She does have an appointment on 11/17/2023 with her primary care doctor for further discussion.  She does not feel like she can work in her current condition  Past Medical History:  Diagnosis Date   ADHD (attention deficit hyperactivity disorder)    Anxiety    Central auditory processing disorder (CAPD)    Chronic post-traumatic stress disorder (PTSD)    Colitis    Depression    Dysmenorrhea    Obsessive-compulsive disorder    Oppositional defiant disorder    Syncopal episodes     Patient Active Problem List   Diagnosis Date Noted   Irritable bowel syndrome with constipation 10/25/2023   Overweight (BMI 25.0-29.9) 10/25/2023   Class 1 obesity due to excess calories without serious comorbidity with body mass index (BMI) of 30.0 to 30.9 in adult 07/25/2023   Chronic left shoulder pain 07/25/2023    Bloating 07/24/2023   Palpitations 10/18/2016   Collapse 10/18/2016   Weight gain 12/25/2013   Left knee pain 05/08/2013   Attention deficit hyperactivity disorder (ADHD), combined type 06/08/2011   OCD (obsessive compulsive disorder) 06/08/2011    Past Surgical History:  Procedure Laterality Date   BICEPS TENDON REPAIR     WISDOM TOOTH EXTRACTION      OB History     Gravida  0   Para      Term      Preterm      AB      Living         SAB      IAB      Ectopic      Multiple      Live Births               Home Medications    Prior to Admission medications   Medication Sig Start Date End Date Taking? Authorizing Provider  amoxicillin -clavulanate (AUGMENTIN ) 875-125 MG tablet Take 1 tablet by mouth every 12 (twelve) hours. 11/15/23  Yes Maranda Jamee Jacob, MD  ALPRAZolam (XANAX) 0.5 MG tablet Take 0.5 mg by mouth daily as needed. 05/20/21   [provider]  cariprazine  (VRAYLAR ) 1.5 MG capsule  Take 1 capsule (1.5 mg total) by mouth daily. 10/25/23   Breeback, Jade L, PA-C  cetirizine (ZYRTEC) 10 MG tablet Take 30 mg by mouth daily.    [provider]  FLUoxetine  (PROZAC ) 20 MG capsule Take 2 capsules (40 mg total) by mouth daily. 10/10/23   Breeback, Jade L, PA-C  lamoTRIgine  (LAMICTAL ) 150 MG tablet Take 1 tablet (150 mg total) by mouth daily. 08/18/23   Breeback, Jade L, PA-C  lisdexamfetamine (VYVANSE ) 40 MG capsule Take 1 capsule (40 mg total) by mouth every morning. 10/25/23   Breeback, Jade L, PA-C  lubiprostone  (AMITIZA ) 24 MCG capsule Take 1 capsule (24 mcg total) by mouth 2 (two) times daily with a meal. 10/25/23   Breeback, Jade L, PA-C  ondansetron  (ZOFRAN -ODT) 4 MG disintegrating tablet Take 1 tablet (4 mg total) by mouth every 8 (eight) hours as needed for nausea or vomiting. 11/13/23   Breeback, Jade L, PA-C  traZODone (DESYREL) 50 MG tablet Take by mouth. 06/09/21   [provider]  amphetamine -dextroamphetamine (ADDERALL XR) 5  MG 24 hr capsule Take 1 capsule (5 mg total) by mouth daily. 10/27/15 11/18/15  Geralene Kaiser, MD  ARIPiprazole  (ABILIFY ) 5 MG tablet Take 1 tablet (5 mg total) by mouth daily. 02/03/15 02/13/15  Geralene Kaiser, MD  gabapentin  (NEURONTIN ) 300 MG capsule Take 1 capsule (300 mg total) by mouth at bedtime. 11/18/15 02/02/16  Geralene Kaiser, MD    Family History Family History  Problem Relation Age of Onset   ADD / ADHD Mother    Thyroid  disease Brother    Hypertension Brother    Hyperlipidemia Brother    Diabetes Brother    Hyperlipidemia Father    Hypertension Father    Bladder Cancer Father    Thyroid  disease Father    Depression Maternal Uncle    Alcohol abuse Maternal Grandfather    Liver cancer Maternal Grandfather    Hypertension Maternal Grandfather    Hyperlipidemia Maternal Grandfather    OCD Maternal Grandmother    Heart attack Maternal Grandmother    Heart Problems Maternal Grandmother     Social History Social History   Tobacco Use   Smoking status: Former    Types: Cigarettes   Smokeless tobacco: Never   Tobacco comments:    smoked on and off from age 89 to 70  Substance Use Topics   Alcohol use: Yes    Alcohol/week: 6.0 standard drinks of alcohol    Types: 6 Standard drinks or equivalent per week    Comment: social   Drug use: No     Allergies   Ciprofloxacin, Penicillins, Semaglutide(0.25 or 0.5mg -dos), Topamax  [topiramate ], and Clindamycin/lincomycin  Ill-defined allergy to penicillins when younger.  Patient states she can take Augmentin  Review of Systems Review of Systems  See HPI Physical Exam Triage Vital Signs ED Triage Vitals  Encounter Vitals Group     BP 11/15/23 1443 120/81     Girls Systolic BP Percentile --      Girls Diastolic BP Percentile --      Boys Systolic BP Percentile --      Boys Diastolic BP Percentile --      Pulse Rate 11/15/23 1443 100     Resp 11/15/23 1443 16     Temp 11/15/23 1443 99.3 F (37.4 C)     Temp src --       SpO2 11/15/23 1443 99 %     Weight --      Height --  Head Circumference --      Peak Flow --      Pain Score 11/15/23 1447 5     Pain Loc --      Pain Education --      Exclude from Growth Chart --    No data found.  Updated Vital Signs BP 120/81   Pulse 100   Temp 99.3 F (37.4 C)   Resp 16   LMP 10/23/2023 (Approximate)   SpO2 99%       Physical Exam Constitutional:      General: She is not in acute distress.    Appearance: She is well-developed and normal weight. She is ill-appearing.  HENT:     Head: Normocephalic and atraumatic.  Eyes:     Conjunctiva/sclera: Conjunctivae normal.     Pupils: Pupils are equal, round, and reactive to light.  Cardiovascular:     Rate and Rhythm: Normal rate.  Pulmonary:     Effort: Pulmonary effort is normal. No respiratory distress.  Abdominal:     General: Abdomen is flat. There is no distension.     Palpations: Abdomen is soft.     Comments: Bowel sounds are absent.  Tenderness to deep palpation in the left mid and lower abdomen.  No masses palpable.  No guarding or rebound.  No hepatosplenomegaly  Musculoskeletal:        General: Normal range of motion.     Cervical back: Normal range of motion.  Skin:    General: Skin is warm and dry.  Neurological:     Mental Status: She is alert.      UC Treatments / Results  Labs (all labs ordered are listed, but only abnormal results are displayed) Labs Reviewed - No data to display  EKG   Radiology DG Abdomen 1 View Result Date: 11/15/2023 CLINICAL DATA:  constipation EXAM: ABDOMEN - 1 VIEW COMPARISON:  November 05, 2017 FINDINGS: Nonobstructive bowel gas pattern. No pneumoperitoneum. No organomegaly or radiopaque calculi. No acute fracture or destructive lesion. The lung bases are clear. IMPRESSION: Nonobstructive bowel gas pattern. Small volume fecal loading in the ascending colon. Electronically Signed   By: Rogelia Myers M.D.   On: 11/15/2023 15:45     Procedures Procedures (including critical care time)  Medications Ordered in UC Medications - No data to display  Initial Impression / Assessment and Plan / UC Course  I have reviewed the triage vital signs and the nursing notes.  Pertinent labs & imaging results that were available during my care of the patient were reviewed by me and considered in my medical decision making (see chart for details).     Patient has a lot of stool in her right colon.  I told her that the diarrhea could be overflow because of her long history of constipation.  Also could be a side effect of the Amatiza.  She feels like it is colitis so I will give her an antibiotic based on her past experience.  She does need to follow-up with her PCP in a couple of days. Final Clinical Impressions(s) / UC Diagnoses   Final diagnoses:  Bloating  Irritable bowel syndrome with constipation  Nausea  Adverse effect of drug, initial encounter     Discharge Instructions      Increase your fluid intake Stay on bland diet Takes MiraLAX tonight I have prescribed Augmentin  to take for the colitis. See your doctor on 11/17/2023   ED Prescriptions     Medication Sig Dispense  Auth. Provider   amoxicillin -clavulanate (AUGMENTIN ) 875-125 MG tablet Take 1 tablet by mouth every 12 (twelve) hours. 14 tablet Maranda Jamee Jacob, MD      PDMP not reviewed this encounter.   Maranda Jamee Jacob, MD 11/15/23 [phone]

## 2023-11-15 NOTE — Telephone Encounter (Signed)
 Patient informed and will stop the Vraylar  to see if symptoms improve

## 2023-11-15 NOTE — ED Triage Notes (Addendum)
 Has c/o n/v/d. Had colitis in 2019, has had bad bowels since then. Has c/o bloating. Thinks she is getting colitis again. Nausea for a while. started amitiza  on Friday, thinks this is the cause of the vomiting and diarrhea. Has been taking zofran . Can't keep anything down. Mucus diarrhea since this morning.

## 2023-11-15 NOTE — Telephone Encounter (Signed)
 Agree with plan. UC if symptoms worsen before Friday. Some people do have diarrhea with vraylar . She can stop the vraylar  and see if symptoms improve.

## 2023-11-15 NOTE — Telephone Encounter (Signed)
 I can see her Friday, double book if needed. Ok to write her out until Friday if she makes appt.

## 2023-11-15 NOTE — Discharge Instructions (Signed)
 Increase your fluid intake Stay on bland diet Takes MiraLAX tonight I have prescribed Augmentin  to take for the colitis. See your doctor on 11/17/2023

## 2023-11-17 ENCOUNTER — Ambulatory Visit (HOSPITAL_BASED_OUTPATIENT_CLINIC_OR_DEPARTMENT_OTHER)
Admission: RE | Admit: 2023-11-17 | Discharge: 2023-11-17 | Disposition: A | Discharge status: Home / Self Care | Source: Ambulatory Visit | Attending: Physician Assistant | Admitting: Physician Assistant

## 2023-11-17 ENCOUNTER — Encounter: Payer: Self-pay | Admitting: Physician Assistant

## 2023-11-17 ENCOUNTER — Ambulatory Visit: Admitting: Physician Assistant

## 2023-11-17 ENCOUNTER — Ambulatory Visit: Payer: Self-pay | Admitting: Physician Assistant

## 2023-11-17 VITALS — BP 115/74 | HR 74 | Temp 99.8°F | Ht 62.0 in | Wt 163.0 lb

## 2023-11-17 DIAGNOSIS — E663 Overweight: Secondary | ICD-10-CM

## 2023-11-17 DIAGNOSIS — R1915 Other abnormal bowel sounds: Secondary | ICD-10-CM | POA: Insufficient documentation

## 2023-11-17 DIAGNOSIS — F902 Attention-deficit hyperactivity disorder, combined type: Secondary | ICD-10-CM

## 2023-11-17 DIAGNOSIS — R109 Unspecified abdominal pain: Secondary | ICD-10-CM

## 2023-11-17 DIAGNOSIS — K5900 Constipation, unspecified: Secondary | ICD-10-CM

## 2023-11-17 DIAGNOSIS — R140 Abdominal distension (gaseous): Secondary | ICD-10-CM | POA: Diagnosis not present

## 2023-11-17 DIAGNOSIS — K581 Irritable bowel syndrome with constipation: Secondary | ICD-10-CM

## 2023-11-17 DIAGNOSIS — K529 Noninfective gastroenteritis and colitis, unspecified: Secondary | ICD-10-CM | POA: Insufficient documentation

## 2023-11-17 DIAGNOSIS — R110 Nausea: Secondary | ICD-10-CM | POA: Diagnosis present

## 2023-11-17 DIAGNOSIS — R198 Other specified symptoms and signs involving the digestive system and abdomen: Secondary | ICD-10-CM | POA: Insufficient documentation

## 2023-11-17 LAB — CMP14+EGFR
ALT: 17 IU/L (ref 0–32)
AST: 19 IU/L (ref 0–40)
Albumin: 4.3 g/dL (ref 3.9–4.9)
Alkaline Phosphatase: 85 IU/L (ref 44–121)
BUN/Creatinine Ratio: 13 (ref 9–23)
BUN: 9 mg/dL (ref 6–20)
Bilirubin Total: 0.2 mg/dL (ref 0.0–1.2)
CO2: 23 mmol/L (ref 20–29)
Calcium: 9.5 mg/dL (ref 8.7–10.2)
Chloride: 103 mmol/L (ref 96–106)
Creatinine, Ser: 0.69 mg/dL (ref 0.57–1.00)
Globulin, Total: 2.3 g/dL (ref 1.5–4.5)
Glucose: 73 mg/dL (ref 70–99)
Potassium: 4.8 mmol/L (ref 3.5–5.2)
Sodium: 138 mmol/L (ref 134–144)
Total Protein: 6.6 g/dL (ref 6.0–8.5)
eGFR: 117 mL/min/1.73 (ref >59)

## 2023-11-17 LAB — POCT URINALYSIS DIP (CLINITEK)
Bilirubin, UA: NEGATIVE
Blood, UA: NEGATIVE
Glucose, UA: NEGATIVE mg/dL
Ketones, POC UA: NEGATIVE mg/dL
Leukocytes, UA: NEGATIVE
Nitrite, UA: NEGATIVE
POC PROTEIN,UA: NEGATIVE
Spec Grav, UA: 1.02 (ref 1.010–1.025)
Urobilinogen, UA: 0.2 U/dL
pH, UA: 7 (ref 5.0–8.0)

## 2023-11-17 LAB — CBC WITH DIFFERENTIAL/PLATELET
Basophils Absolute: 0 x10E3/uL (ref 0.0–0.2)
Basos: 1 %
EOS (ABSOLUTE): 0.3 x10E3/uL (ref 0.0–0.4)
Eos: 5 %
Hematocrit: 37.2 % (ref 34.0–46.6)
Hemoglobin: 12.9 g/dL (ref 11.1–15.9)
Immature Granulocytes: 0 %
Lymphocytes Absolute: 2.2 x10E3/uL (ref 0.7–3.1)
Lymphs: 36 %
MCH: 30.8 pg (ref 26.6–33.0)
MCHC: 34.7 g/dL (ref 31.5–35.7)
MCV: 89 fL (ref 79–97)
Monocytes Absolute: 0.4 x10E3/uL (ref 0.1–0.9)
Monocytes: 6 %
Neutrophils Absolute: 3.2 x10E3/uL (ref 1.4–7.0)
Neutrophils: 52 %
Platelets: 337 x10E3/uL (ref 150–450)
RBC: 4.19 x10E6/uL (ref 3.77–5.28)
RDW: 11.6 % — ABNORMAL LOW (ref 11.7–15.4)
WBC: 6.1 x10E3/uL (ref 3.4–10.8)

## 2023-11-17 LAB — LIPASE: Lipase: 47 U/L (ref 14–72)

## 2023-11-17 MED ORDER — IOHEXOL 300 MG/ML  SOLN
100.0000 mL | Freq: Once | INTRAMUSCULAR | Status: AC | PRN
  Administered 2023-11-17: 100 mL via INTRAVENOUS

## 2023-11-17 NOTE — Patient Instructions (Signed)
 Get stat labs Get CT [facility]

## 2023-11-17 NOTE — Progress Notes (Signed)
 Please call patient.  Normal appendix.  No bowel blockage or colitis. You can stop augmentin .  Collapsing cyst of the left ovary but do not think this is what is causing your pain Large amount of stool.   I need you to take 2 Dulcolax 5mg  then 2 hours after mix entire bottle of Miralax with 64 oz of gatorade and drink 8oz every 20 to 30 minutes until entire solution is gone.

## 2023-11-17 NOTE — Progress Notes (Signed)
 Channie,   WBC is normal.  Hemoglobin is normal.  Kidney, liver, glucose is normal.  Electrolytes are normal and reassuring.  Lipase is normal.   Labs are very reassuring. Will be on lookout for CT.

## 2023-11-17 NOTE — Progress Notes (Signed)
 Acute Office Visit  Subjective:     Patient ID: Jennifer Singh, female    DOB: 12-23-1990, 33 y.o.   MRN: [id number]  No chief complaint on file.   HPI Patient is in today for abdominal pain more right than left, nausea, constipation that has been worsening over the last week or so. She has hx of IBS-C and hx of colitis. She is taking the amitiza . She has not had formed bowel movements but she has had more cramping and mucus in stools. She feels like she cannot eat. She is taking zofran . Xray was done in UC on 11/15/2023 and found a lot of stool in right side of colon. She was told to start miralax and augmentin  and to follow up with PCP.   ROS See HPI.      Objective:    LMP 10/23/2023 (Approximate)  BP Readings from Last 3 Encounters:  11/17/23 115/74  11/15/23 120/81  10/25/23 118/74   Wt Readings from Last 3 Encounters:  11/17/23 163 lb (73.9 kg)  10/25/23 156 lb (70.8 kg)  08/21/23 160 lb (72.6 kg)      Physical Exam Constitutional:      Appearance: Normal appearance.  HENT:     Head: Normocephalic.  Cardiovascular:     Rate and Rhythm: Normal rate.  Pulmonary:     Effort: Pulmonary effort is normal.  Abdominal:     General: There is distension.     Palpations: Abdomen is soft. There is no mass.     Tenderness: There is abdominal tenderness. There is guarding. There is no right CVA tenderness, left CVA tenderness or rebound.     Hernia: No hernia is present.     Comments: Minimal bowel sounds Guarding and severe tenderness over right upper quadrant and right lower quadrant.   Musculoskeletal:     Right lower leg: No edema.     Left lower leg: Edema present.  Neurological:     General: No focal deficit present.     Mental Status: She is alert and oriented to person, place, and time.  Psychiatric:        Mood and Affect: Mood normal.    .. Results for orders placed or performed in visit on 11/17/23  POCT URINALYSIS DIP (CLINITEK)   Collection Time:  11/17/23 12:20 PM  Result Value Ref Range   Color, UA yellow yellow   Clarity, UA clear clear   Glucose, UA negative negative mg/dL   Bilirubin, UA negative negative   Ketones, POC UA negative negative mg/dL   Spec Grav, UA 8.979 8.989 - 1.025   Blood, UA negative negative   pH, UA 7.0 5.0 - 8.0   POC PROTEIN,UA negative negative, trace   Urobilinogen, UA 0.2 0.2 or 1.0 E.U./dL   Nitrite, UA Negative Negative   Leukocytes, UA Negative Negative         Assessment & Plan:  SABRASABRADiagnoses and all orders for this visit:  Right lower quadrant guarding -     CT ABDOMEN PELVIS W CONTRAST; Future -     CBC w/Diff/Platelet -     CMP14+EGFR -     Lipase  Bloating  Nausea -     CT ABDOMEN PELVIS W CONTRAST; Future  Constipation, unspecified constipation type -     CT ABDOMEN PELVIS W CONTRAST; Future -     CBC w/Diff/Platelet -     CMP14+EGFR -     Lipase  Decreased bowel sounds -  CT ABDOMEN PELVIS W CONTRAST; Future -     CBC w/Diff/Platelet -     CMP14+EGFR -     Lipase  Right sided abdominal pain -     CBC w/Diff/Platelet -     CMP14+EGFR -     Lipase   Concerned with new abdominal guarding on the right lower and upper abdomen concerned for bowel obstruction vs appendicitis vs diverticulitis Pt does have low grade fever UA negative signs of infection STAT CT of abdomen ordered STAT CBC/CMP/lipase Continue augmentin  Continue with zofran  Will discuss treatment plan with labs and CT results If constipation still present will aggressively treat with bowel cleanout regimen with ducolax, miralax, and Gatorade.   Simon Aaberg, PA-C

## 2023-11-19 ENCOUNTER — Encounter: Payer: Self-pay | Admitting: Physician Assistant

## 2023-11-19 DIAGNOSIS — Z8719 Personal history of other diseases of the digestive system: Secondary | ICD-10-CM

## 2023-11-19 DIAGNOSIS — R198 Other specified symptoms and signs involving the digestive system and abdomen: Secondary | ICD-10-CM

## 2023-11-19 DIAGNOSIS — R110 Nausea: Secondary | ICD-10-CM

## 2023-11-19 DIAGNOSIS — K921 Melena: Secondary | ICD-10-CM

## 2023-11-19 DIAGNOSIS — K581 Irritable bowel syndrome with constipation: Secondary | ICD-10-CM

## 2023-11-20 NOTE — Telephone Encounter (Signed)
 Pt had GI referral through ER. I can make another if needed but wanted to see how soon we could get patient in?

## 2023-11-20 NOTE — Telephone Encounter (Signed)
 Called patient and had given her contact information for referral mentioned in ER notes.  She did call Dr. Arthurine at [phone] and was told that the urgent referral from the hospital had not been received and was requesting that we resend this referral for the patient.   Pended referral

## 2023-11-21 ENCOUNTER — Encounter: Payer: Self-pay | Admitting: Physician Assistant

## 2023-11-21 ENCOUNTER — Ambulatory Visit: Admitting: Physician Assistant

## 2023-11-21 VITALS — BP 118/80 | HR 87 | Temp 98.0°F | Ht 62.0 in | Wt 163.0 lb

## 2023-11-21 DIAGNOSIS — K5909 Other constipation: Secondary | ICD-10-CM

## 2023-11-21 DIAGNOSIS — K581 Irritable bowel syndrome with constipation: Secondary | ICD-10-CM

## 2023-11-21 DIAGNOSIS — Z800 Family history of malignant neoplasm of digestive organs: Secondary | ICD-10-CM | POA: Diagnosis not present

## 2023-11-21 MED ORDER — LINACLOTIDE 290 MCG PO CAPS: 290.0000 ug | ORAL_CAPSULE | Freq: Every day | ORAL | 1 refills | Status: DC

## 2023-11-21 NOTE — Patient Instructions (Signed)
 Keep doing miralax twice a day.  Stop amitiza  and then start linzess  daily.  Keep GI appt for 7/28

## 2023-11-21 NOTE — Progress Notes (Signed)
   Established Patient Office Visit  Subjective   Patient ID: Jennifer Singh, female    DOB: 10/02/1990  Age: 33 y.o. MRN: [id number]  Chief Complaint  Patient presents with   Hospitalization Follow-up    HPI Pt is a 33 yo female who presents to the clinic to follow up on constipation and right lower quadrant pain. She was seen in clinic on 11/17/23 and CT ordered with labs.  Her CT showed severe constipation and patient was started on bowel clean out. Her labs were unremarkable. She was started on a bowel clean out and started having loose stools with bright red blood in them. She went to ED. Abdominal xray was done with no acute abnormality. And she was given zofran  and magnesium citrate. She was referred to GI and has appt on 12/04/23. She continues to take amitiza  daily.     ROS See HPI.    Objective:     BP 118/80   Pulse 87   Temp 98 F (36.7 C) (Oral)   Ht 5' 2 (1.575 m)   Wt 163 lb (73.9 kg)   LMP 10/23/2023 (Approximate)   SpO2 99%   BMI 29.81 kg/m  BP Readings from Last 3 Encounters:  11/21/23 118/80  11/17/23 115/74  11/15/23 120/81   Wt Readings from Last 3 Encounters:  11/21/23 163 lb (73.9 kg)  11/17/23 163 lb (73.9 kg)  10/25/23 156 lb (70.8 kg)      Physical Exam Constitutional:      Appearance: Normal appearance.  HENT:     Head: Normocephalic.  Cardiovascular:     Rate and Rhythm: Normal rate and regular rhythm.  Pulmonary:     Effort: Pulmonary effort is normal.  Abdominal:     General: Bowel sounds are normal. There is no distension.     Palpations: Abdomen is soft. There is no mass.     Tenderness: There is no abdominal tenderness. There is no right CVA tenderness, left CVA tenderness or guarding.  Neurological:     General: No focal deficit present.     Mental Status: She is alert and oriented to person, place, and time.  Psychiatric:        Mood and Affect: Mood normal.        Assessment & Plan:  Jennifer Singh was seen today for  hospitalization follow-up.  Diagnoses and all orders for this visit:  Chronic constipation with overflow  Irritable bowel syndrome with constipation -     linaclotide  (LINZESS ) 290 MCG CAPS capsule; Take 1 capsule (290 mcg total) by mouth daily before breakfast.  Family history of colon cancer   Appt with GI on 7/28 to keep Blood in stool has resolved Reassuring CT of abdomen Discussed overflow diarrhea due to constipation Stop amitiza  and start linzess  Keep doing miralax with 4oz of gaterade twice a day Follow up as needed Make sure drinking plenty of water Needs FMLA paperwork filled out for intermittent leave due to constipation/abdominal pain/nausea/overflow diarrhea Ok for up to 2 episodes up to 2-3 days per episodes Forms filled out and given to patient Follow up in 4 weeks or sooner if needed   Jennifer Bologna, PA-C

## 2023-11-24 ENCOUNTER — Telehealth: Payer: Self-pay

## 2023-11-24 NOTE — Telephone Encounter (Signed)
 Patient called . States she had had about 6 episodes of Diarrhea with blood in it today - the last had a lot of blood in it  and seemed worse than the others.  She would like to know if she should just wait to see GI for this on Monday 12/04/23 or what [facility] would recommend that she do.

## 2023-11-27 ENCOUNTER — Encounter: Payer: Self-pay | Admitting: Physician Assistant

## 2023-11-27 DIAGNOSIS — Z800 Family history of malignant neoplasm of digestive organs: Secondary | ICD-10-CM | POA: Insufficient documentation

## 2023-11-27 DIAGNOSIS — K5909 Other constipation: Secondary | ICD-10-CM | POA: Insufficient documentation

## 2023-11-28 NOTE — Telephone Encounter (Signed)
 Spoke with patient and was told that she misread the voice mail - she is fine to go  the visit with GI scheduled for tomorrow.

## 2023-11-30 ENCOUNTER — Ambulatory Visit: Admitting: Family Medicine

## 2024-01-01 ENCOUNTER — Encounter: Payer: Self-pay | Admitting: Physician Assistant

## 2024-01-01 DIAGNOSIS — F902 Attention-deficit hyperactivity disorder, combined type: Secondary | ICD-10-CM

## 2024-01-01 MED ORDER — LISDEXAMFETAMINE DIMESYLATE 40 MG PO CAPS: 40.0000 mg | ORAL_CAPSULE | ORAL | 0 refills | Status: DC

## 2024-01-30 ENCOUNTER — Other Ambulatory Visit: Payer: Self-pay | Admitting: Medical Genetics

## 2024-02-06 ENCOUNTER — Ambulatory Visit (INDEPENDENT_AMBULATORY_CARE_PROVIDER_SITE_OTHER): Admitting: Physician Assistant

## 2024-02-06 ENCOUNTER — Encounter: Payer: Self-pay | Admitting: Physician Assistant

## 2024-02-06 VITALS — BP 114/61 | HR 74 | Ht 62.0 in | Wt 164.0 lb

## 2024-02-06 DIAGNOSIS — K5909 Other constipation: Secondary | ICD-10-CM

## 2024-02-06 DIAGNOSIS — H6123 Impacted cerumen, bilateral: Secondary | ICD-10-CM

## 2024-02-06 DIAGNOSIS — G4719 Other hypersomnia: Secondary | ICD-10-CM

## 2024-02-06 DIAGNOSIS — E6609 Other obesity due to excess calories: Secondary | ICD-10-CM

## 2024-02-06 DIAGNOSIS — F902 Attention-deficit hyperactivity disorder, combined type: Secondary | ICD-10-CM

## 2024-02-06 DIAGNOSIS — H60322 Hemorrhagic otitis externa, left ear: Secondary | ICD-10-CM

## 2024-02-06 DIAGNOSIS — E66811 Obesity, class 1: Secondary | ICD-10-CM

## 2024-02-06 DIAGNOSIS — K581 Irritable bowel syndrome with constipation: Secondary | ICD-10-CM

## 2024-02-06 DIAGNOSIS — F419 Anxiety disorder, unspecified: Secondary | ICD-10-CM

## 2024-02-06 DIAGNOSIS — Z13220 Encounter for screening for lipoid disorders: Secondary | ICD-10-CM

## 2024-02-06 DIAGNOSIS — Z0000 Encounter for general adult medical examination without abnormal findings: Secondary | ICD-10-CM | POA: Diagnosis not present

## 2024-02-06 DIAGNOSIS — Z131 Encounter for screening for diabetes mellitus: Secondary | ICD-10-CM | POA: Diagnosis not present

## 2024-02-06 MED ORDER — LISDEXAMFETAMINE DIMESYLATE 50 MG PO CAPS
50.0000 mg | ORAL_CAPSULE | Freq: Every day | ORAL | 0 refills | Status: AC
Start: 2024-03-07 — End: ?

## 2024-02-06 MED ORDER — LISDEXAMFETAMINE DIMESYLATE 50 MG PO CAPS
50.0000 mg | ORAL_CAPSULE | Freq: Every day | ORAL | 0 refills | Status: AC
Start: 2024-04-06 — End: ?

## 2024-02-06 MED ORDER — LINACLOTIDE 145 MCG PO CAPS: 145.0000 ug | ORAL_CAPSULE | Freq: Every day | ORAL | 1 refills | Status: AC

## 2024-02-06 MED ORDER — LISDEXAMFETAMINE DIMESYLATE 50 MG PO CAPS: 50.0000 mg | ORAL_CAPSULE | Freq: Every day | ORAL | 0 refills | Status: AC

## 2024-02-06 MED ORDER — LINACLOTIDE 145 MCG PO CAPS: 145.0000 ug | ORAL_CAPSULE | Freq: Every day | ORAL | 1 refills | Status: DC

## 2024-02-06 MED ORDER — ALPRAZOLAM 0.5 MG PO TABS: 0.5000 mg | ORAL_TABLET | Freq: Every day | ORAL | 1 refills | Status: AC | PRN

## 2024-02-06 MED ORDER — CIPROFLOXACIN-DEXAMETHASONE 0.3-0.1 % OT SUSP: 4.0000 [drp] | Freq: Two times a day (BID) | OTIC | 0 refills | Status: DC

## 2024-02-06 MED ORDER — CIPROFLOXACIN-DEXAMETHASONE 0.3-0.1 % OT SUSP: 4.0000 [drp] | Freq: Two times a day (BID) | OTIC | 0 refills | Status: AC

## 2024-02-06 NOTE — Progress Notes (Signed)
 Complete physical exam  Patient: Jennifer Singh   DOB: 21-Dec-1990   33 y.o. Female  MRN: [id number]  Subjective:    Chief Complaint  Patient presents with   Annual Exam   Discussed the use of AI scribe software for clinical note transcription with the patient, who gave verbal consent to proceed.  History of Present Illness Jennifer Singh is a 33 year old female who presents for a complete physical exam and to address her medication dose of Linzess .  Chronic constipation and gastrointestinal symptoms - Chronic constipation managed with multiple medications. - Colonoscopy performed on December 06, 2023; results not available for review. - Upper colon biopsied during colonoscopy, revealing four benign lesions; lower colon not biopsied. - Follow-up colonoscopy scheduled for January 22, 2024, rescheduled to October due to COVID-19. - Previously prescribed high-dose Linzess , discontinued due to significant discomfort. - Prior trial of Motegrity, perceived as more effective, but only used for one week. - Amitiza  trialed, caused significant nausea. - Currently using Miralax and increased water intake for symptom management.  Fatigue and sleep disturbance - Significant fatigue present. - Worked four consecutive 12-hour shifts with minimal sleep, resulting in exhaustion. - Persistent exhaustion despite extended sleep on days off. - Recent COVID-19 infection. - No snoring reported. - Family history of sleep apnea in father, mother, and brother. - Wife concerned about excessive sleep.  Neuropsychiatric symptoms and medication use - Currently taking Vyvanse  for focus and fluoxetine  for mood. - Lamictal  dose reduced to 75 mg. - No Xanax use in the past six months. - Trazodone used for sleep.  Weight gain and physical fitness - Weight gain since addressing gastrointestinal issues. - Attributes some weight gain to water retention and menstrual cycle. - Initiated weight lifting and  focusing on physical fitness.   Most recent fall risk assessment:    07/24/2023   12:07 PM  Fall Risk   Falls in the past year? 0  Number falls in past yr: 0  Injury with Fall? 0  Risk for fall due to : No Fall Risks  Follow up Falls evaluation completed     Most recent depression screenings:    02/06/2024   11:32 AM 07/24/2023   12:07 PM  PHQ 2/9 Scores  PHQ - 2 Score 0 0  PHQ- 9 Score 9 6    Vision:Within last year and Dental: No current dental problems and Receives regular dental care  Patient Active Problem List   Diagnosis Date Noted   Bilateral impacted cerumen 02/06/2024   Chronic constipation with overflow 11/27/2023   Family history of colon cancer 11/27/2023   Colitis    Irritable bowel syndrome with constipation 10/25/2023   Overweight (BMI 25.0-29.9) 10/25/2023   Class 1 obesity due to excess calories without serious comorbidity with body mass index (BMI) of 30.0 to 30.9 in adult 07/25/2023   Chronic left shoulder pain 07/25/2023   Bloating 07/24/2023   Palpitations 10/18/2016   Collapse 10/18/2016   Weight gain 12/25/2013   Left knee pain 05/08/2013   Attention deficit hyperactivity disorder (ADHD), combined type 06/08/2011   OCD (obsessive compulsive disorder) 06/08/2011   Past Medical History:  Diagnosis Date   ADHD (attention deficit hyperactivity disorder)    Anxiety    Central auditory processing disorder (CAPD)    Chronic post-traumatic stress disorder (PTSD)    Colitis    Depression    Dysmenorrhea    Obsessive-compulsive disorder    Oppositional defiant disorder    Syncopal episodes  Past Surgical History:  Procedure Laterality Date   BICEPS TENDON REPAIR     WISDOM TOOTH EXTRACTION     Family History  Problem Relation Age of Onset   ADD / ADHD Mother    Hyperlipidemia Father    Hypertension Father    Bladder Cancer Father    Thyroid  disease Father    Colon cancer Father    Thyroid  disease Brother    Hypertension Brother     Hyperlipidemia Brother    Diabetes Brother    Depression Maternal Uncle    OCD Maternal Grandmother    Heart attack Maternal Grandmother    Heart Problems Maternal Grandmother    Alcohol abuse Maternal Grandfather    Liver cancer Maternal Grandfather    Hypertension Maternal Grandfather    Hyperlipidemia Maternal Grandfather    Allergies  Allergen Reactions   Ciprofloxacin Swelling   Penicillins Other (See Comments)    unknown   Semaglutide(0.25 Or 0.5mg -Dos)     Nausea/vomiting/diarrhea   Topamax  [Topiramate ]     Irritable and mood changes.    Clindamycin/Lincomycin Rash      Patient Care Team: Avo Schlachter L, PA-C as PCP - General (Family Medicine)   Outpatient Medications Prior to Visit  Medication Sig   cetirizine (ZYRTEC) 10 MG tablet Take 30 mg by mouth daily.   FLUoxetine  (PROZAC ) 20 MG capsule Take 2 capsules (40 mg total) by mouth daily.   lamoTRIgine  (LAMICTAL ) 150 MG tablet Take 1 tablet (150 mg total) by mouth daily.   [DISCONTINUED] ALPRAZolam (XANAX) 0.5 MG tablet Take 0.5 mg by mouth daily as needed.   [DISCONTINUED] cariprazine  (VRAYLAR ) 1.5 MG capsule Take 1 capsule (1.5 mg total) by mouth daily.   [DISCONTINUED] linaclotide  (LINZESS ) 290 MCG CAPS capsule Take 1 capsule (290 mcg total) by mouth daily before breakfast.   [DISCONTINUED] lisdexamfetamine (VYVANSE ) 40 MG capsule Take 1 capsule (40 mg total) by mouth every morning.   [DISCONTINUED] ondansetron  (ZOFRAN -ODT) 4 MG disintegrating tablet Take 1 tablet (4 mg total) by mouth every 8 (eight) hours as needed for nausea or vomiting.   [DISCONTINUED] traZODone (DESYREL) 50 MG tablet Take by mouth.   No facility-administered medications prior to visit.    Review of Systems  All other systems reviewed and are negative.         Objective:     BP 114/61   Pulse 74   Ht 5' 2 (1.575 m)   Wt 164 lb (74.4 kg)   SpO2 99%   BMI 30.00 kg/m  BP Readings from Last 3 Encounters:  02/06/24 114/61   11/21/23 118/80  11/17/23 115/74   Wt Readings from Last 3 Encounters:  02/06/24 164 lb (74.4 kg)  11/21/23 163 lb (73.9 kg)  11/17/23 163 lb (73.9 kg)     Cerumen Removal Template: Indication: Cerumen impaction of bilateral ear(s) Medical necessity statement: On physical examination, cerumen impairs clinically significant portions of the external auditory canal, and tympanic membrane. Noted obstructive, copious cerumen that cannot be removed without magnification and instrumentations requiring physician skills Consent: Discussed benefits and risks of procedure and verbal consent obtained Procedure: Patient was prepped for the procedure. Utilized an otoscope to assess and take note of the ear canal, the tympanic membrane, and the presence, amount, and placement of the cerumen. Gentle water irrigation and soft plastic curette was utilized to remove cerumen.  Post procedure examination shows cerumen was completely removed. Patient tolerated procedure well. The patient is made aware that they may experience temporary  vertigo, temporary hearing loss, and temporary discomfort. If these symptom last for more than 24 hours to call the clinic or proceed to the ED.  Physical Exam  BP 114/61   Pulse 74   Ht 5' 2 (1.575 m)   Wt 164 lb (74.4 kg)   SpO2 99%   BMI 30.00 kg/m   General Appearance:    Alert, cooperative, no distress, appears stated age  Head:    Normocephalic, without obvious abnormality, atraumatic  Eyes:    PERRL, conjunctiva/corneas clear, EOM's intact, fundi    benign, both eyes  Ears:    Cerumen impacted bilaterally. After irrigation right TM clear. Left TM erythematous and continued to have some purulent discharge noted.   Nose:   Nares normal, septum midline, mucosa normal, no drainage    or sinus tenderness  Throat:   Lips, mucosa, and tongue normal; teeth and gums normal  Neck:   Supple, symmetrical, trachea midline, no adenopathy;    thyroid :  no  enlargement/tenderness/nodules; no carotid   bruit or JVD  Back:     Symmetric, no curvature, ROM normal, no CVA tenderness  Lungs:     Clear to auscultation bilaterally, respirations unlabored  Chest Wall:    No tenderness or deformity   Heart:    Regular rate and rhythm, S1 and S2 normal, no murmur, rub   or gallop     Abdomen:     Soft, non-tender, bowel sounds active all four quadrants,    no masses, no organomegaly        Extremities:   Extremities normal, atraumatic, no cyanosis or edema  Pulses:   2+ and symmetric all extremities  Skin:   Skin color, texture, turgor normal, no rashes or lesions  Lymph nodes:   Cervical, supraclavicular, and axillary nodes normal  Neurologic:   CNII-XII intact, normal strength, sensation and reflexes    throughout      Assessment & Plan:    Routine Health Maintenance and Physical Exam  Immunization History  Administered Date(s) Administered   INFLUENZA, HIGH DOSE SEASONAL PF 01/22/2024   PFIZER(Purple Top)SARS-COV-2 Vaccination 05/10/2023   Tdap 05/10/2023    Health Maintenance  Topic Date Due   Hepatitis B Vaccines 19-59 Average Risk (1 of 3 - 19+ 3-dose series) Never done   HPV VACCINES (1 - 3-dose SCDM series) Never done   COVID-19 Vaccine (2 - 2025-26 season) 01/08/2024   Cervical Cancer Screening (HPV/Pap Cotest)  02/01/2028   DTaP/Tdap/Td (2 - Td or Tdap) 05/09/2033   Influenza Vaccine  Completed   Hepatitis C Screening  Completed   HIV Screening  Completed   Pneumococcal Vaccine  Aged Out   Meningococcal B Vaccine  Aged Out    Discussed health benefits of physical activity, and encouraged her to engage in regular exercise appropriate for her age and condition. Woodward was seen today for annual exam.  Diagnoses and all orders for this visit:  Encounter for annual physical exam -     CBC with Differential/Platelet -     CMP14+EGFR -     Lipid panel -     VITAMIN D 25 Hydroxy (Vit-D Deficiency, Fractures) -     B12 and  Folate Panel -     TSH + free T4  Class 1 obesity due to excess calories without serious comorbidity with body mass index (BMI) of 30.0 to 30.9 in adult -     Ambulatory referral to Sleep Studies  Screening for lipid disorders -  Lipid panel  Screening for diabetes mellitus -     CMP14+EGFR  Excessive daytime sleepiness -     CBC with Differential/Platelet -     CMP14+EGFR -     VITAMIN D 25 Hydroxy (Vit-D Deficiency, Fractures) -     B12 and Folate Panel -     TSH + free T4 -     Ambulatory referral to Sleep Studies  Bilateral impacted cerumen  Attention deficit hyperactivity disorder (ADHD), combined type -     lisdexamfetamine (VYVANSE ) 50 MG capsule; Take 1 capsule (50 mg total) by mouth daily. -     lisdexamfetamine (VYVANSE ) 50 MG capsule; Take 1 capsule (50 mg total) by mouth daily. -     lisdexamfetamine (VYVANSE ) 50 MG capsule; Take 1 capsule (50 mg total) by mouth daily.  Anxiety -     ALPRAZolam (XANAX) 0.5 MG tablet; Take 1 tablet (0.5 mg total) by mouth daily as needed.  Irritable bowel syndrome with constipation -     linaclotide  (LINZESS ) 145 MCG CAPS capsule; Take 1 capsule (145 mcg total) by mouth daily.  Chronic constipation with overflow -     linaclotide  (LINZESS ) 145 MCG CAPS capsule; Take 1 capsule (145 mcg total) by mouth daily.  Acute hemorrhagic otitis externa of left ear -     ciprofloxacin-dexamethasone (CIPRODEX) OTIC suspension; Place 4 drops into the left ear 2 (two) times daily for 7 days.  Other orders -     Discontinue: linaclotide  (LINZESS ) 145 MCG CAPS capsule; Take 1 capsule (145 mcg total) by mouth daily. -     Discontinue: ciprofloxacin-dexamethasone (CIPRODEX) OTIC suspension; Place 4 drops into the left ear 2 (two) times daily for 7 days.    Assessment & Plan Adult Wellness Visit Routine wellness visit with normal blood pressure. Flu vaccination given 2.5 weeks ago. Pap smear current, next due September 2029. - Continue  routine health maintenance and screenings per guidelines. - fasting labs ordered today.  Chronic constipation and irritable bowel syndrome with constipation Chronic constipation with IBS-C. Previous colonoscopy in July 2025 showed benign upper GI lesions. High dose Linzess  caused adverse effects, currently on Miralax. Open to lower dose Linzess . - Prescribe Linzess  145 mcg daily. - Continue Miralax twice daily. - Encourage high fluid intake. - Monitor bowel movements and adjust treatment as necessary.  Excessive daytime sleepiness Excessive daytime sleepiness possibly due to irregular sleep and potential sleep apnea. Family history of sleep apnea. Recent work schedule contributing to fatigue. Consider sleep apnea testing. - Order home sleep apnea test. - Evaluate B12 levels, anemia, and thyroid  function as part of routine labs.  Impacted cerumen, bilateral Bilateral impacted cerumen obstructing eardrum view, especially left ear. Discussed debrox OTC available for home cleaning. - Perform ear irrigation in office. - Advise on home ear cleaning techniques. - left ear canal was erythematous and some discharge noted in canal. Will treat with ciprodex for otitis externa.    Return in about 3 months (around 05/07/2024).     Shondrika Hoque, PA-C

## 2024-02-06 NOTE — Patient Instructions (Signed)

## 2024-02-06 NOTE — Telephone Encounter (Unsigned)
 Copied from CRM #[id number]. Topic: Clinical - Prescription Issue >> Feb 06, 2024  2:42 PM Amy B wrote: Reason for CRM: Patient received Rx today for ciprofloxacin-dexamethasone (CIPRODEX) otic suspension but she is ALLERGIC TO CIPRO.  She requests an alternative medication to be sent to her pharmacy and states she is in a lot of pain.

## 2024-02-07 ENCOUNTER — Ambulatory Visit: Payer: Self-pay | Admitting: Physician Assistant

## 2024-02-07 ENCOUNTER — Encounter: Payer: Self-pay | Admitting: Physician Assistant

## 2024-02-07 LAB — CMP14+EGFR
ALT: 18 IU/L (ref 0–32)
AST: 17 IU/L (ref 0–40)
Albumin: 4.5 g/dL (ref 3.9–4.9)
Alkaline Phosphatase: 98 IU/L (ref 41–116)
BUN/Creatinine Ratio: 16 (ref 9–23)
BUN: 11 mg/dL (ref 6–20)
Bilirubin Total: 0.4 mg/dL (ref 0.0–1.2)
CO2: 23 mmol/L (ref 20–29)
Calcium: 9.2 mg/dL (ref 8.7–10.2)
Chloride: 103 mmol/L (ref 96–106)
Creatinine, Ser: 0.7 mg/dL (ref 0.57–1.00)
Globulin, Total: 2.4 g/dL (ref 1.5–4.5)
Glucose: 84 mg/dL (ref 70–99)
Potassium: 4.5 mmol/L (ref 3.5–5.2)
Sodium: 139 mmol/L (ref 134–144)
Total Protein: 6.9 g/dL (ref 6.0–8.5)
eGFR: 117 mL/min/1.73 (ref >59)

## 2024-02-07 LAB — CBC WITH DIFFERENTIAL/PLATELET
Basophils Absolute: 0.1 x10E3/uL (ref 0.0–0.2)
Basos: 1 %
EOS (ABSOLUTE): 0.3 x10E3/uL (ref 0.0–0.4)
Eos: 3 %
Hematocrit: 39.7 % (ref 34.0–46.6)
Hemoglobin: 13 g/dL (ref 11.1–15.9)
Immature Grans (Abs): 0 x10E3/uL (ref 0.0–0.1)
Immature Granulocytes: 0 %
Lymphocytes Absolute: 2.2 x10E3/uL (ref 0.7–3.1)
Lymphs: 30 %
MCH: 30.2 pg (ref 26.6–33.0)
MCHC: 32.7 g/dL (ref 31.5–35.7)
MCV: 92 fL (ref 79–97)
Monocytes Absolute: 0.3 x10E3/uL (ref 0.1–0.9)
Monocytes: 5 %
Neutrophils Absolute: 4.5 x10E3/uL (ref 1.4–7.0)
Neutrophils: 61 %
Platelets: 326 x10E3/uL (ref 150–450)
RBC: 4.31 x10E6/uL (ref 3.77–5.28)
RDW: 11.9 % (ref 11.7–15.4)
WBC: 7.3 x10E3/uL (ref 3.4–10.8)

## 2024-02-07 LAB — TSH+FREE T4
Free T4: 1.05 ng/dL (ref 0.82–1.77)
TSH: 1.54 u[IU]/mL (ref 0.450–4.500)

## 2024-02-07 LAB — B12 AND FOLATE PANEL
Folate: 12.2 ng/mL (ref >3.0)
Vitamin B-12: 688 pg/mL (ref 232–1245)

## 2024-02-07 LAB — LIPID PANEL
Chol/HDL Ratio: 2.8 ratio (ref 0.0–4.4)
Cholesterol, Total: 135 mg/dL (ref 100–199)
HDL: 48 mg/dL (ref >39)
LDL Chol Calc (NIH): 73 mg/dL (ref 0–99)
Triglycerides: 66 mg/dL (ref 0–149)
VLDL Cholesterol Cal: 14 mg/dL (ref 5–40)

## 2024-02-07 LAB — VITAMIN D 25 HYDROXY (VIT D DEFICIENCY, FRACTURES): Vit D, 25-Hydroxy: 42.4 ng/mL (ref 30.0–100.0)

## 2024-02-07 NOTE — Progress Notes (Signed)
 Calayah,   Hemoglobin looks good.  Kidney, liver, glucose look good.  Cholesterol looks great.  Vitamin D normal.  B12 and folate look great.  Your thyroid  will fluctuate a little from time to time. It looks great and in the perfect range of TSH 1-2.

## 2024-02-12 ENCOUNTER — Encounter: Payer: Self-pay | Admitting: Physician Assistant

## 2024-03-27 ENCOUNTER — Encounter: Payer: Self-pay | Admitting: Physician Assistant

## 2024-04-01 MED ORDER — METFORMIN HCL 500 MG PO TABS
500.0000 mg | ORAL_TABLET | Freq: Two times a day (BID) | ORAL | 1 refills | Status: AC
Start: 2024-04-01 — End: ?

## 2024-04-17 ENCOUNTER — Encounter: Payer: Self-pay | Admitting: Physician Assistant

## 2024-04-17 MED ORDER — ONDANSETRON 8 MG PO TBDP: 8.0000 mg | ORAL_TABLET | Freq: Three times a day (TID) | ORAL | 1 refills | Status: AC | PRN

## 2024-04-17 NOTE — Telephone Encounter (Signed)
 Ok for work note for GI symptoms for today and tomorrow. If symptom continue or more work is needed to be missed will need appt.

## 2024-05-15 ENCOUNTER — Encounter: Payer: Self-pay | Admitting: Physician Assistant

## 2024-05-15 ENCOUNTER — Telehealth: Payer: Self-pay

## 2024-05-15 ENCOUNTER — Telehealth: Admitting: Physician Assistant

## 2024-05-15 VITALS — Temp 101.8°F

## 2024-05-15 DIAGNOSIS — U071 COVID-19: Secondary | ICD-10-CM

## 2024-05-15 DIAGNOSIS — R051 Acute cough: Secondary | ICD-10-CM | POA: Diagnosis not present

## 2024-05-15 MED ORDER — BENZONATATE 200 MG PO CAPS: 200.0000 mg | ORAL_CAPSULE | Freq: Three times a day (TID) | ORAL | 0 refills | Status: AC | PRN

## 2024-05-15 MED ORDER — NIRMATRELVIR/RITONAVIR (PAXLOVID)TABLET: 3.0000 | ORAL_TABLET | Freq: Two times a day (BID) | ORAL | 0 refills | Status: AC

## 2024-05-15 NOTE — Progress Notes (Signed)
..  Virtual Visit via Video Note  I connected with Jennifer Singh on 05/15/2024 at  4:40 PM EST by a video enabled telemedicine application and verified that I am speaking with the correct person using two identifiers.  Location: Patient: home Provider: clinic  .Participating in visit:  Patient: Jennifer Singh Provider: Vermell Bologna PA-C   I discussed the limitations of evaluation and management by telemedicine and the availability of in person appointments. The patient expressed understanding and agreed to proceed.  History of Present Illness: Patient presents to the clinic with positive home covid test. Yesterday she started feeling bad and today spiked a fever and sent home from work. She is very achy and tired. She has a fever and productive cough. No trouble breathing. She has not had covid vaccine this year. She is taking OTC Tylenol cold and sinus.    Observations/Objective: No acute distress Normal breathing Flushed cheeks Productive cough  .SABRA Today's Vitals   05/15/24 1645  Temp: (!) 101.8 F (38.8 C)  TempSrc: Oral   There is no height or weight on file to calculate BMI.    Assessment and Plan: .Diagnoses and all orders for this visit:  COVID-19 virus infection -     nirmatrelvir /ritonavir  (PAXLOVID ) 20 x 150 MG & 10 x 100MG  TABS; Take 3 tablets by mouth 2 (two) times daily for 5 days. -     benzonatate  (TESSALON ) 200 MG capsule; Take 1 capsule (200 mg total) by mouth 3 (three) times daily as needed.  Acute cough -     benzonatate  (TESSALON ) 200 MG capsule; Take 1 capsule (200 mg total) by mouth 3 (three) times daily as needed.   Home covid test positive Within window to treat with paxlovid  Discussed symptomatic care Note given to be out of work Tessalon  capsules to use as needed for cough Follow up as needed if symptoms persist or worsen Do not go back to work until fever free for 24 hours.   Follow Up Instructions:    I discussed the assessment and  treatment plan with the patient. The patient was provided an opportunity to ask questions and all were answered. The patient agreed with the plan and demonstrated an understanding of the instructions.   The patient was advised to call back or seek an in-person evaluation if the symptoms worsen or if the condition fails to improve as anticipated.   Kingstyn Deruiter, PA-C

## 2024-05-15 NOTE — Telephone Encounter (Signed)
 Put her on for virtual this afternoon and we can discuss symptomatic control, note for work and antiviral. Ok to double book.

## 2024-05-15 NOTE — Telephone Encounter (Signed)
 Called patient and schedule virtual appointment for today 05/15/2024 at 4:40pm with Jade.

## 2024-05-15 NOTE — Telephone Encounter (Signed)
 Copied from CRM #[id number]. Topic: Clinical - Medical Advice >> May 15, 2024 12:42 PM Winona R wrote: Pt tested positive for Covid with fever, fatigue, chills. Pt would like to know if she can receive a excuse from work note and would like to know what she can do moving forward.

## 2024-05-15 NOTE — Telephone Encounter (Signed)
 Ok to put her on for virtual this afternoon. Ok for work note  but need to talk to her to determine how long.
